# Patient Record
Sex: Male | Born: 1975 | Race: White | Hispanic: No | Marital: Married | Smoking: Former smoker
Health system: Southern US, Community
[De-identification: ages and names within clinical notes are randomized; demographics above are authoritative.]

## PROBLEM LIST (undated history)

## (undated) DIAGNOSIS — S42309A Unspecified fracture of shaft of humerus, unspecified arm, initial encounter for closed fracture: Secondary | ICD-10-CM

## (undated) DIAGNOSIS — G4700 Insomnia, unspecified: Secondary | ICD-10-CM

## (undated) DIAGNOSIS — F909 Attention-deficit hyperactivity disorder, unspecified type: Secondary | ICD-10-CM

## (undated) HISTORY — DX: Unspecified fracture of shaft of humerus, unspecified arm, initial encounter for closed fracture: S42.309A

## (undated) HISTORY — PX: VASECTOMY: SHX75

## (undated) HISTORY — DX: Attention-deficit hyperactivity disorder, unspecified type: F90.9

## (undated) HISTORY — DX: Insomnia, unspecified: G47.00

---

## 2007-11-13 ENCOUNTER — Emergency Department (HOSPITAL_COMMUNITY): Admission: EM | Admit: 2007-11-13 | Discharge: 2007-11-13 | Payer: Self-pay | Admitting: Emergency Medicine

## 2008-09-04 ENCOUNTER — Emergency Department (HOSPITAL_BASED_OUTPATIENT_CLINIC_OR_DEPARTMENT_OTHER): Admission: EM | Admit: 2008-09-04 | Discharge: 2008-09-04 | Payer: Self-pay | Admitting: Emergency Medicine

## 2011-05-21 ENCOUNTER — Ambulatory Visit: Payer: Worker's Compensation

## 2013-10-23 ENCOUNTER — Ambulatory Visit: Payer: Self-pay

## 2013-10-23 ENCOUNTER — Other Ambulatory Visit: Payer: Self-pay | Admitting: Occupational Medicine

## 2013-10-23 ENCOUNTER — Other Ambulatory Visit: Payer: Self-pay | Admitting: Family Medicine

## 2013-10-23 DIAGNOSIS — Z0000 Encounter for general adult medical examination without abnormal findings: Secondary | ICD-10-CM

## 2013-10-23 DIAGNOSIS — R911 Solitary pulmonary nodule: Secondary | ICD-10-CM

## 2013-10-25 ENCOUNTER — Ambulatory Visit
Admission: RE | Admit: 2013-10-25 | Discharge: 2013-10-25 | Disposition: A | Discharge status: Home / Self Care | Payer: 59 | Source: Ambulatory Visit | Attending: Family Medicine | Admitting: Family Medicine

## 2013-10-25 DIAGNOSIS — R911 Solitary pulmonary nodule: Secondary | ICD-10-CM

## 2013-10-25 MED ORDER — IOHEXOL 300 MG/ML  SOLN
75.0000 mL | Freq: Once | INTRAMUSCULAR | Status: AC | PRN
  Administered 2013-10-25: 75 mL via INTRAVENOUS

## 2015-02-07 ENCOUNTER — Other Ambulatory Visit: Payer: Self-pay | Admitting: Family Medicine

## 2015-02-07 DIAGNOSIS — R911 Solitary pulmonary nodule: Secondary | ICD-10-CM

## 2015-02-12 ENCOUNTER — Other Ambulatory Visit: Payer: Self-pay

## 2016-07-23 DIAGNOSIS — G479 Sleep disorder, unspecified: Secondary | ICD-10-CM | POA: Diagnosis not present

## 2016-08-03 DIAGNOSIS — J069 Acute upper respiratory infection, unspecified: Secondary | ICD-10-CM | POA: Diagnosis not present

## 2016-12-10 DIAGNOSIS — S90812A Abrasion, left foot, initial encounter: Secondary | ICD-10-CM | POA: Diagnosis not present

## 2016-12-14 DIAGNOSIS — T25221D Burn of second degree of right foot, subsequent encounter: Secondary | ICD-10-CM | POA: Diagnosis not present

## 2016-12-14 DIAGNOSIS — T25222D Burn of second degree of left foot, subsequent encounter: Secondary | ICD-10-CM | POA: Diagnosis not present

## 2017-11-03 DIAGNOSIS — G4726 Circadian rhythm sleep disorder, shift work type: Secondary | ICD-10-CM | POA: Diagnosis not present

## 2018-12-16 ENCOUNTER — Ambulatory Visit (INDEPENDENT_AMBULATORY_CARE_PROVIDER_SITE_OTHER): Payer: 59 | Admitting: Family Medicine

## 2018-12-16 ENCOUNTER — Encounter: Payer: Self-pay | Admitting: Family Medicine

## 2018-12-16 DIAGNOSIS — F909 Attention-deficit hyperactivity disorder, unspecified type: Secondary | ICD-10-CM | POA: Insufficient documentation

## 2018-12-16 DIAGNOSIS — G4700 Insomnia, unspecified: Secondary | ICD-10-CM

## 2018-12-16 MED ORDER — METHYLPHENIDATE HCL 20 MG PO TABS: 20.0000 mg | ORAL_TABLET | Freq: Three times a day (TID) | ORAL | 0 refills | Status: DC

## 2018-12-16 NOTE — Progress Notes (Signed)
Chief Complaint:  Danny Hurst is a 43 y.o. male who presents today for a virtual office visit with a chief complaint of ADHD and to establish care.  Assessment/Plan:  Insomnia Database reviewed no red flags.  He will continue taking Ambien 5 to 10 mg as needed.  Attention deficit hyperactivity disorder (ADHD) Database reviewed no red flags protein records from previous PCP.  Will refill Ritalin 20 mg 3 times daily.  Discussed potential side effects.  Follow-up in 3 to 6 months.    Subjective:  HPI:  ADHD Several year history.  Has been on methylphenidate 20 mg 3 times daily for the past 10 years.  He is switching to a new office to be closer to his home.  Previously his medications were managed by his PCP.  He has not been on any other medications for this in the past.  He thinks that his medications work relatively well.  Has not been on any medication for the past month or so.  No reported chest pain, palpitations, or shortness of breath.   Insomnia Patient also works alternating shift at [employer].  Will occasionally take Ambien 5 to 10 mg as needed to help with switching sleep schedules.  Typically works 3-4 nights shifts per week.  Medication works well.  Tolerates well without side effects.  Uses only a couple times per week.  No other obvious alleviating or aggravating factors.   ROS: Per HPI, otherwise a complete review of systems was negative.   PMH:  The following were reviewed and entered/updated in epic: Past Medical History:  Diagnosis Date  . ADHD   . Broken arm   . Insomnia    Patient Active Problem List   Diagnosis Date Noted  . Attention deficit hyperactivity disorder (ADHD) 12/16/2018  . Insomnia 12/16/2018   History reviewed. No pertinent surgical history.  Family History  Problem Relation Age of Onset  . Diabetes type I Daughter   . Heart disease Neg Hx   . Colon cancer Neg Hx   . Prostate cancer Neg Hx     Medications- reviewed and updated Current  Outpatient Medications  Medication Sig Dispense Refill  . methylphenidate (RITALIN) 20 MG tablet Take 1 tablet (20 mg total) by mouth 3 (three) times daily with meals. 90 tablet 0  . zolpidem (AMBIEN) 10 MG tablet TK 1 T PO QHS PRN     No current facility-administered medications for this visit.     Allergies-reviewed and updated No Known Allergies  Social History   Socioeconomic History  . Marital status: Married    Spouse name: Not on file  . Number of children: Not on file  . Years of education: Not on file  . Highest education level: Not on file  Occupational History  . Not on file  Social Needs  . Financial resource strain: Not on file  . Food insecurity    Worry: Not on file    Inability: Not on file  . Transportation needs    Medical: Not on file    Non-medical: Not on file  Tobacco Use  . Smoking status: Former Smoker    Years: 28.00    Types: Cigarettes    Start date: 1995    Quit date: 2013    Years since quitting: 7.6  . Smokeless tobacco: Former Network engineer and Sexual Activity  . Alcohol use: Not on file    Comment: occasional  . Drug use: Not on file  . Sexual  activity: Not on file  Lifestyle  . Physical activity    Days per week: Not on file    Minutes per session: Not on file  . Stress: Not on file  Relationships  . Social Herbalist on phone: Not on file    Gets together: Not on file    Attends religious service: Not on file    Active member of club or organization: Not on file    Attends meetings of clubs or organizations: Not on file    Relationship status: Not on file  Other Topics Concern  . Not on file  Social History Narrative  . Not on file          Objective/Observations  Physical Exam: Gen: NAD, resting comfortably Eyes: Extraocular movements intact.  No scleral icterus Cardiovascular: No peripheral edema Pulm: Normal work of breathing MSK: Upper extremities with full range of motion.  No digital cyanosis  Skin: No rashes or lesions. Neuro: Grossly normal, moves all extremities Psych: Normal affect and thought content  Virtual Visit via Video   I connected with Danny Hurst on 12/16/18 at 11:00 AM EDT by a video enabled telemedicine application and verified that I am speaking with the correct person using two identifiers. I discussed the limitations of evaluation and management by telemedicine and the availability of in person appointments. The patient expressed understanding and agreed to proceed.   Patient location: Home Provider location: [place] participating in the virtual visit: Myself and Patient     Algis Greenhouse. Jerline Pain, MD 12/16/2018 11:43 AM

## 2018-12-16 NOTE — Assessment & Plan Note (Signed)
Database reviewed no red flags protein records from previous PCP.  Will refill Ritalin 20 mg 3 times daily.  Discussed potential side effects.  Follow-up in 3 to 6 months.

## 2018-12-16 NOTE — Assessment & Plan Note (Signed)
Database reviewed no red flags.  He will continue taking Ambien 5 to 10 mg as needed.

## 2019-02-08 ENCOUNTER — Other Ambulatory Visit: Payer: Self-pay

## 2019-02-08 DIAGNOSIS — Z20822 Contact with and (suspected) exposure to covid-19: Secondary | ICD-10-CM

## 2019-02-09 LAB — NOVEL CORONAVIRUS, NAA: SARS-CoV-2, NAA: NOT DETECTED

## 2019-02-16 ENCOUNTER — Other Ambulatory Visit: Payer: Self-pay | Admitting: Family Medicine

## 2019-02-16 MED ORDER — METHYLPHENIDATE HCL 20 MG PO TABS: 20.0000 mg | ORAL_TABLET | Freq: Three times a day (TID) | ORAL | 0 refills | Status: DC

## 2019-02-16 NOTE — Telephone Encounter (Signed)
Last OV: 12/16/18 Next OV: None scheduled at this time PMP website checked: last filled 01/16/19

## 2019-03-20 ENCOUNTER — Other Ambulatory Visit: Payer: Self-pay | Admitting: Physician Assistant

## 2019-03-20 ENCOUNTER — Other Ambulatory Visit: Payer: Self-pay

## 2019-03-20 ENCOUNTER — Ambulatory Visit: Payer: Self-pay

## 2019-03-20 ENCOUNTER — Other Ambulatory Visit: Payer: Self-pay | Admitting: Family Medicine

## 2019-03-20 DIAGNOSIS — Z0000 Encounter for general adult medical examination without abnormal findings: Secondary | ICD-10-CM

## 2019-03-20 MED ORDER — METHYLPHENIDATE HCL 20 MG PO TABS: 20.0000 mg | ORAL_TABLET | Freq: Three times a day (TID) | ORAL | 0 refills | Status: DC

## 2019-03-20 NOTE — Telephone Encounter (Signed)
Rx request 

## 2019-04-18 ENCOUNTER — Other Ambulatory Visit: Payer: Self-pay | Admitting: Family Medicine

## 2019-04-19 MED ORDER — METHYLPHENIDATE HCL 20 MG PO TABS: 20.0000 mg | ORAL_TABLET | Freq: Three times a day (TID) | ORAL | 0 refills | Status: DC

## 2019-05-20 ENCOUNTER — Other Ambulatory Visit: Payer: Self-pay | Admitting: Family Medicine

## 2019-05-22 MED ORDER — METHYLPHENIDATE HCL 20 MG PO TABS: 20.0000 mg | ORAL_TABLET | Freq: Three times a day (TID) | ORAL | 0 refills | Status: DC

## 2019-05-22 NOTE — Telephone Encounter (Signed)
Last OV: 12/16/18 Next OV: none scheduled at this time PMP website checked: last filled on 04/19/19 for a qty of #90

## 2019-06-22 ENCOUNTER — Other Ambulatory Visit: Payer: Self-pay | Admitting: Family Medicine

## 2019-06-22 NOTE — Telephone Encounter (Signed)
Requesting refills, pt has not been seen since 12/22/18.

## 2019-06-23 MED ORDER — METHYLPHENIDATE HCL 20 MG PO TABS: 20.0000 mg | ORAL_TABLET | Freq: Three times a day (TID) | ORAL | 0 refills | Status: DC

## 2019-06-23 NOTE — Telephone Encounter (Signed)
Please ask pt to schedule f/u OV soon.

## 2019-07-21 ENCOUNTER — Telehealth: Payer: Self-pay | Admitting: Family Medicine

## 2019-07-21 MED ORDER — METHYLPHENIDATE HCL 20 MG PO TABS: 20.0000 mg | ORAL_TABLET | Freq: Three times a day (TID) | ORAL | 0 refills | Status: DC

## 2019-07-21 NOTE — Telephone Encounter (Signed)
LAST APPOINTMENT DATE:12/16/2018  NEXT APPOINTMENT DATE:@Visit  date not found  rX Ritalin 20mg  LAST REFILL:06/23/2019  QTY: #90 0Rf

## 2019-07-21 NOTE — Telephone Encounter (Signed)
He has not been seen in over 6 months - will send in 15 day supply. He needs OV for further refills.

## 2019-07-24 NOTE — Telephone Encounter (Signed)
Pt called stating [facility] does not have prescription. Please advise.

## 2019-07-24 NOTE — Telephone Encounter (Signed)
[employer].Rx was received.  LVM to Patient, needs OV, Rx was send to pharmacy

## 2019-07-28 ENCOUNTER — Other Ambulatory Visit: Payer: Self-pay

## 2019-07-28 ENCOUNTER — Ambulatory Visit (INDEPENDENT_AMBULATORY_CARE_PROVIDER_SITE_OTHER): Payer: 59 | Admitting: Family Medicine

## 2019-07-28 ENCOUNTER — Encounter: Payer: Self-pay | Admitting: Family Medicine

## 2019-07-28 VITALS — BP 110/80 | HR 55 | Temp 97.3°F | Ht 76.0 in | Wt 214.6 lb

## 2019-07-28 DIAGNOSIS — G4700 Insomnia, unspecified: Secondary | ICD-10-CM

## 2019-07-28 DIAGNOSIS — Z3009 Encounter for other general counseling and advice on contraception: Secondary | ICD-10-CM

## 2019-07-28 DIAGNOSIS — F909 Attention-deficit hyperactivity disorder, unspecified type: Secondary | ICD-10-CM | POA: Diagnosis not present

## 2019-07-28 DIAGNOSIS — L989 Disorder of the skin and subcutaneous tissue, unspecified: Secondary | ICD-10-CM | POA: Diagnosis not present

## 2019-07-28 MED ORDER — ZOLPIDEM TARTRATE 10 MG PO TABS: ORAL_TABLET | ORAL | 5 refills | Status: DC

## 2019-07-28 NOTE — Patient Instructions (Signed)
It was very nice to see you today!  We will send those 2 spots off your biopsy.  You should hear something back next week.  I will refill your medication.  We will place referral for you to see the urologist to discuss vasectomy.  Come back in 6 months, or sooner if needed  Take care, Dr Jerline Pain  Please try these tips to maintain a healthy lifestyle:   Eat at least 3 REAL meals and 1-2 snacks per day.  Aim for no more than 5 hours between eating.  If you eat breakfast, please do so within one hour of getting up.    Each meal should contain half fruits/vegetables, one quarter protein, and one quarter carbs (no bigger than a computer mouse)   Cut down on sweet beverages. This includes juice, soda, and sweet tea.     Drink at least 1 glass of water with each meal and aim for at least 8 glasses per day   Exercise at least 150 minutes every week.

## 2019-07-28 NOTE — Assessment & Plan Note (Signed)
Stable.  Database with no red flags.  Continue Ritalin 20 mg 3 times daily.  Follow-up in 6 months.

## 2019-07-28 NOTE — Progress Notes (Signed)
   Danny Hurst is a 44 y.o. male who presents today for an office visit.  Assessment/Plan:  New/Acute Problems: Skin Lesions Shave biopsy performed today.  See below procedure note.    Chronic Problems Addressed Today: Insomnia Database no red flags.  Symptoms are stable.  Will refill Ambien 5 to 10 mg nightly as needed due to shift work.  Attention deficit hyperactivity disorder (ADHD) Stable.  Database with no red flags.  Continue Ritalin 20 mg 3 times daily.  Follow-up in 6 months.     Subjective:  HPI:  Patient here for medication refill.  He is tolerating both his Ambien and Ritalin well without side effects.  No palpitations.  No reported chest pain or shortness of breath.  No reported next-day grogginess.  He also has a couple of skin lesions he would like to have looked at.  1 located on the left flank and one on right chest.  Both of them present for several months.       Objective:  Physical Exam: BP 110/80 (BP Location: Right Arm, Patient Position: Sitting, Cuff Size: Large)   Pulse (!) 55   Temp (!) 97.3 F (36.3 C) (Temporal)   Ht 6\' 4"  (1.93 m)   Wt 214 lb 9.6 oz (97.3 kg)   SpO2 96%   BMI 26.12 kg/m   Gen: No acute distress, resting comfortably CV: Regular rate and rhythm with no murmurs appreciated Pulm: Normal work of breathing, clear to auscultation bilaterally with no crackles, wheezes, or rhonchi Skin: Well-circumscribed 5 mm pigmented lesion on left flank.  Pedunculated fleshy lesion on right chest just below right nipple. Neuro: Grossly normal, moves all extremities Psych: Normal affect and thought content  Shave Biopsy Procedure Note  Pre-operative Diagnosis: Suspicious lesion  Post-operative Diagnosis: same  Locations: Right chest and left flank  Indications: Diagnostic  Anesthesia: 1% lidocaine with epinephrine.  Procedure Details  History of allergy to iodine: no  Patient informed of the risks (including bleeding and infection)  and benefits of the  procedure and Verbal informed consent obtained.  The left flank lesion and surrounding area were given a sterile prep using betadyne and draped in the usual sterile fashion. A scalpel was used to shave an area of skin approximately 1x1cm on the left flank. Hemostasis achieved with silver nitrate. Antibiotic ointment and a sterile dressing applied.  The specimen was sent for pathologic examination. The patient tolerated the procedure well.  The right chest lesion and surrounding area were given a sterile prep using betadyne and draped in the usual sterile fashion. A scalpel was used to shave an area of skin approximately 0.5x0.5cm on the right chest. Hemostasis achieved with silver nitrate. Antibiotic ointment and a sterile dressing applied.  The specimen was sent for pathologic examination. The patient tolerated the procedure well.  EBL: 2 ml  Findings: Suspicious skin lesion  Condition: Stable  Complications: none.  Plan: 1. Instructed to keep the wound dry and covered for 24-48h and clean thereafter. 2. Warning signs of infection were reviewed.   3. Recommended that the patient use OTC analgesics as needed for pain.      Algis Greenhouse. Jerline Pain, MD 07/28/2019 8:42 AM

## 2019-07-28 NOTE — Assessment & Plan Note (Signed)
Database no red flags.  Symptoms are stable.  Will refill Ambien 5 to 10 mg nightly as needed due to shift work.

## 2019-08-02 NOTE — Progress Notes (Signed)
Please inform patient of the following:  Good news! Both skin lesions we took off were benign - no need to do any further removal at this point.

## 2019-08-07 ENCOUNTER — Telehealth: Payer: Self-pay | Admitting: Family Medicine

## 2019-08-07 NOTE — Telephone Encounter (Signed)
Pt called stating Dr. Jerline Pain refilled his Ritalin 20 MG last week. Pt states the prescription only included 45 tablets which will only get him through half a month. Pt states he usually gets prescribed 90 tablets for a month. Please advise.

## 2019-08-07 NOTE — Telephone Encounter (Signed)
Please advise 

## 2019-08-08 MED ORDER — METHYLPHENIDATE HCL 20 MG PO TABS: 20.0000 mg | ORAL_TABLET | Freq: Three times a day (TID) | ORAL | 0 refills | Status: DC

## 2019-08-08 NOTE — Telephone Encounter (Signed)
New rx sent in with 90 tablets.   Algis Greenhouse. Jerline Pain, MD 08/08/2019 8:02 AM

## 2019-09-07 ENCOUNTER — Other Ambulatory Visit: Payer: Self-pay | Admitting: Family Medicine

## 2019-09-07 NOTE — Telephone Encounter (Signed)
Rx request 

## 2019-09-11 MED ORDER — METHYLPHENIDATE HCL 20 MG PO TABS: 20.0000 mg | ORAL_TABLET | Freq: Three times a day (TID) | ORAL | 0 refills | Status: DC

## 2019-10-11 ENCOUNTER — Other Ambulatory Visit: Payer: Self-pay | Admitting: Family Medicine

## 2019-10-11 MED ORDER — METHYLPHENIDATE HCL 20 MG PO TABS: 20.0000 mg | ORAL_TABLET | Freq: Three times a day (TID) | ORAL | 0 refills | Status: DC

## 2019-10-11 NOTE — Telephone Encounter (Signed)
Rx Request 

## 2019-11-13 ENCOUNTER — Other Ambulatory Visit: Payer: Self-pay | Admitting: Family Medicine

## 2019-11-13 MED ORDER — METHYLPHENIDATE HCL 20 MG PO TABS: 20.0000 mg | ORAL_TABLET | Freq: Three times a day (TID) | ORAL | 0 refills | Status: DC

## 2019-11-13 NOTE — Telephone Encounter (Signed)
Rx request 

## 2019-12-13 ENCOUNTER — Other Ambulatory Visit: Payer: Self-pay | Admitting: Family Medicine

## 2019-12-14 MED ORDER — METHYLPHENIDATE HCL 20 MG PO TABS: 20.0000 mg | ORAL_TABLET | Freq: Three times a day (TID) | ORAL | 0 refills | Status: DC

## 2019-12-14 NOTE — Telephone Encounter (Signed)
LAST APPOINTMENT DATE: 07/28/2019   NEXT APPOINTMENT DATE: Visit date not found    LAST REFILL: 11/13/2019  QTY: #90 no rf

## 2019-12-18 MED ORDER — METHYLPHENIDATE HCL 20 MG PO TABS: 20.0000 mg | ORAL_TABLET | Freq: Three times a day (TID) | ORAL | 0 refills | Status: DC

## 2019-12-18 NOTE — Addendum Note (Signed)
Addended by: Marian Sorrow on: 12/18/2019 09:27 AM   Modules accepted: Orders

## 2019-12-18 NOTE — Telephone Encounter (Signed)
Dr. Jerline Pain please resend did not go to pharmacy.

## 2020-01-17 ENCOUNTER — Other Ambulatory Visit: Payer: Self-pay | Admitting: Family Medicine

## 2020-01-17 NOTE — Telephone Encounter (Signed)
Pt requesting Rx Ritalin Refills

## 2020-01-17 NOTE — Telephone Encounter (Signed)
.   LAST APPOINTMENT DATE: 12/13/2019   NEXT APPOINTMENT DATE:@Visit  date not found  MEDICATION:methylphenidate (RITALIN) 20 MG tablet  [place], [place] - [address] AT [address]  **Let patient know to contact pharmacy at the end of the day to make sure medication is ready. **  ** Please notify patient to allow 48-72 hours to process**  **Encourage patient to contact the pharmacy for refills or they can request refills through [facility]**  CLINICAL FILLS OUT ALL BELOW:   LAST REFILL:  QTY:  REFILL DATE:    OTHER COMMENTS:    Okay for refill?  Please advise

## 2020-01-19 MED ORDER — METHYLPHENIDATE HCL 20 MG PO TABS: 20.0000 mg | ORAL_TABLET | Freq: Three times a day (TID) | ORAL | 0 refills | Status: DC

## 2020-01-23 ENCOUNTER — Other Ambulatory Visit: Payer: Self-pay

## 2020-01-23 ENCOUNTER — Telehealth (INDEPENDENT_AMBULATORY_CARE_PROVIDER_SITE_OTHER): Payer: 59 | Admitting: Family Medicine

## 2020-01-23 DIAGNOSIS — J0100 Acute maxillary sinusitis, unspecified: Secondary | ICD-10-CM | POA: Diagnosis not present

## 2020-01-23 MED ORDER — AMOXICILLIN-POT CLAVULANATE 875-125 MG PO TABS: 1.0000 | ORAL_TABLET | Freq: Two times a day (BID) | ORAL | 0 refills | Status: DC

## 2020-01-23 NOTE — Progress Notes (Signed)
Virtual Visit via Video Note  I connected with Tivon  on 01/23/20 at 10:20 AM EDT by a video enabled telemedicine application and verified that I am speaking with the correct person using two identifiers.  Location patient: home, [place] Location provider:work or home office Persons participating in the virtual visit: patient, provider  I discussed the limitations of evaluation and management by telemedicine and the availability of in person appointments. The patient expressed understanding and agreed to proceed.   HPI:  Acute telemedicine visit for sinus congestion: -Onset: started about 12-13 days ago -Symptoms include: sinus congestion, sinus drainage, now worsening with sinus pain when leans forward and thick sinus congestion -has had 3 negative tests - [employer] home kits -Denies: fevers, CP, SOB, NVD -Has tried: nasal saline -COVID-19 vaccine status: fully vaccinated -Pertinent past medical history: sinus infection  -denies a history of abx augmentin  ROS: See pertinent positives and negatives per HPI.  Past Medical History:  Diagnosis Date  . ADHD   . Broken arm   . Insomnia     No past surgical history on file.   Current Outpatient Medications:  .  amoxicillin-clavulanate (AUGMENTIN) 875-125 MG tablet, Take 1 tablet by mouth 2 (two) times daily., Disp: 20 tablet, Rfl: 0 .  methylphenidate (RITALIN) 20 MG tablet, Take 1 tablet (20 mg total) by mouth 3 (three) times daily with meals., Disp: 90 tablet, Rfl: 0 .  zolpidem (AMBIEN) 10 MG tablet, TK 1 T PO QHS PRN, Disp: 30 tablet, Rfl: 5  EXAM:  VITALS per patient if applicable:  GENERAL: alert, oriented, appears well and in no acute distress  HEENT: atraumatic, conjunttiva clear, no obvious abnormalities on inspection of external nose and ears, He points to his right maxillary sinus is an area of concern.  NECK: normal movements of the head and neck  LUNGS: on inspection no signs of respiratory distress, breathing  rate appears normal, no obvious gross SOB, gasping or wheezing  CV: no obvious cyanosis  MS: moves all visible extremities without noticeable abnormality  PSYCH/NEURO: pleasant and cooperative, no obvious depression or anxiety, speech and thought processing grossly intact  ASSESSMENT AND PLAN:  Discussed the following assessment and plan:  Acute maxillary sinusitis, recurrence not specified  -we discussed possible serious and likely etiologies, options for evaluation and workup, limitations of telemedicine visit vs in person visit, treatment, treatment risks and precautions. Pt prefers to treat via telemedicine empirically rather then risking or undertaking an in person visit at this moment.  He opted to try empiric treatment with Augmentin 875 twice daily x7 to 10 days for possible sinusitis. Scheduled follow up with PCP offered:  Advised to seek prompt follow up telemedicine visit or in person care if worsening, new symptoms arise, or if is not improving with treatment. Did let the patient know that I only do telemedicine on Tuesdays and Thursdays for [place] and advised follow up visit with PCP or [facility] in the event of further concerns or questions.   I discussed the assessment and treatment plan with the patient. The patient was provided an opportunity to ask questions and all were answered. The patient agreed with the plan and demonstrated an understanding of the instructions.     Lucretia Kern, DO

## 2020-02-21 ENCOUNTER — Telehealth (INDEPENDENT_AMBULATORY_CARE_PROVIDER_SITE_OTHER): Payer: 59 | Admitting: Family Medicine

## 2020-02-21 ENCOUNTER — Encounter: Payer: Self-pay | Admitting: Family Medicine

## 2020-02-21 DIAGNOSIS — F909 Attention-deficit hyperactivity disorder, unspecified type: Secondary | ICD-10-CM

## 2020-02-21 DIAGNOSIS — G4700 Insomnia, unspecified: Secondary | ICD-10-CM

## 2020-02-21 MED ORDER — METHYLPHENIDATE HCL 20 MG PO TABS: 20.0000 mg | ORAL_TABLET | Freq: Three times a day (TID) | ORAL | 0 refills | Status: DC

## 2020-02-21 MED ORDER — METHYLPHENIDATE HCL 20 MG PO TABS: 20.0000 mg | ORAL_TABLET | Freq: Two times a day (BID) | ORAL | 0 refills | Status: DC

## 2020-02-21 MED ORDER — ZOLPIDEM TARTRATE 10 MG PO TABS: ORAL_TABLET | ORAL | 5 refills | Status: DC

## 2020-02-21 NOTE — Assessment & Plan Note (Signed)
Stable.  MS with no red flags.  Medications help with that he stay focused and on task.  We will continue Ritalin 20 mg 3 times daily.

## 2020-02-21 NOTE — Assessment & Plan Note (Signed)
Database without red flags. No reported side effects.  Will refill Ambien 10 mg daily.

## 2020-02-21 NOTE — Progress Notes (Signed)
   ELLEN MAYOL is a 44 y.o. male who presents today for a virtual office visit.  Assessment/Plan:  Chronic Problems Addressed Today: Insomnia Database without red flags. No reported side effects.  Will refill Ambien 10 mg daily.  Attention deficit hyperactivity disorder (ADHD) Stable.  MS with no red flags.  Medications help with that he stay focused and on task.  We will continue Ritalin 20 mg 3 times daily.     Subjective:  HPI:  See A/p.         Objective/Observations  Physical Exam: Gen: NAD, resting comfortably Pulm: Normal work of breathing Neuro: Grossly normal, moves all extremities Psych: Normal affect and thought content  Virtual Visit via Video   I connected with Mariel Craft on 02/21/20 at 11:20 AM EDT by a video enabled telemedicine application and verified that I am speaking with the correct person using two identifiers. The limitations of evaluation and management by telemedicine and the availability of in person appointments were discussed. The patient expressed understanding and agreed to proceed.   Patient location: Home Provider location: [place] participating in the virtual visit: Myself and Patient     Algis Greenhouse. Jerline Pain, MD 02/21/2020 11:26 AM

## 2020-03-22 ENCOUNTER — Telehealth: Payer: Self-pay

## 2020-03-22 NOTE — Telephone Encounter (Signed)
See below

## 2020-03-22 NOTE — Telephone Encounter (Signed)
..   LAST APPOINTMENT DATE: 01/17/2020   NEXT APPOINTMENT DATE:@Visit  date not found  MEDICATION:methylphenidate (RITALIN) 20 MG tablet    PHARMACY: [facility] [place], [place] AT [address]     Pt is on his last pill and leaving for [facility] on Sunday. I told pt that if this does not get filled today, because Dr. Jerline Pain is out, then to call us Monday morning and give [place] a pharmacy near where he will be to send it too.

## 2020-03-25 NOTE — Telephone Encounter (Signed)
LVM to return call with pharmacy information

## 2020-03-25 NOTE — Telephone Encounter (Signed)
I am ok with sending in but need to know what pharmacy to send it to.  Algis Greenhouse. Jerline Pain, MD 03/25/2020 8:00 AM

## 2020-03-26 ENCOUNTER — Other Ambulatory Visit: Payer: Self-pay

## 2020-03-26 MED ORDER — METHYLPHENIDATE HCL 20 MG PO TABS: 20.0000 mg | ORAL_TABLET | Freq: Three times a day (TID) | ORAL | 0 refills | Status: DC

## 2020-03-26 NOTE — Telephone Encounter (Signed)
MEDICATION: Ritalin 20 MG  PHARMACY: [facility]   [address], [place] [address]  Comments:   **Let patient know to contact pharmacy at the end of the day to make sure medication is ready. **  ** Please notify patient to allow 48-72 hours to process**  **Encourage patient to contact the pharmacy for refills or they can request refills through [facility]**

## 2020-03-26 NOTE — Telephone Encounter (Signed)
Refills sent

## 2020-03-26 NOTE — Telephone Encounter (Signed)
Pt requesting refills send to [facility] pharmacy  [place] [address]

## 2020-03-26 NOTE — Telephone Encounter (Signed)
Patient call with Pharmacy information

## 2020-04-25 ENCOUNTER — Other Ambulatory Visit: Payer: Self-pay | Admitting: Family Medicine

## 2020-04-25 MED ORDER — METHYLPHENIDATE HCL 20 MG PO TABS: 20.0000 mg | ORAL_TABLET | Freq: Three times a day (TID) | ORAL | 0 refills | Status: DC

## 2020-05-27 ENCOUNTER — Other Ambulatory Visit: Payer: Self-pay | Admitting: Family Medicine

## 2020-05-28 MED ORDER — METHYLPHENIDATE HCL 20 MG PO TABS: 20.0000 mg | ORAL_TABLET | Freq: Three times a day (TID) | ORAL | 0 refills | Status: DC

## 2020-05-28 NOTE — Telephone Encounter (Signed)
Last refill: 04/25/20 #90, 0 Last OV: 02/21/20 dx. ADHD

## 2020-06-27 ENCOUNTER — Other Ambulatory Visit: Payer: Self-pay | Admitting: Family Medicine

## 2020-06-27 MED ORDER — METHYLPHENIDATE HCL 20 MG PO TABS: 20.0000 mg | ORAL_TABLET | Freq: Three times a day (TID) | ORAL | 0 refills | Status: DC

## 2020-07-24 ENCOUNTER — Other Ambulatory Visit: Payer: Self-pay | Admitting: Family Medicine

## 2020-07-24 MED ORDER — METHYLPHENIDATE HCL 20 MG PO TABS: 20.0000 mg | ORAL_TABLET | Freq: Three times a day (TID) | ORAL | 0 refills | Status: DC

## 2020-08-23 ENCOUNTER — Other Ambulatory Visit: Payer: Self-pay | Admitting: Family Medicine

## 2020-08-23 MED ORDER — METHYLPHENIDATE HCL 20 MG PO TABS: 20.0000 mg | ORAL_TABLET | Freq: Three times a day (TID) | ORAL | 0 refills | Status: DC

## 2020-08-23 NOTE — Telephone Encounter (Signed)
Please let patient know he needs a 6 month follow up visit for more refills.

## 2020-09-23 ENCOUNTER — Other Ambulatory Visit: Payer: Self-pay | Admitting: Family Medicine

## 2020-09-23 MED ORDER — METHYLPHENIDATE HCL 20 MG PO TABS: 20.0000 mg | ORAL_TABLET | Freq: Three times a day (TID) | ORAL | 0 refills | Status: DC

## 2020-09-23 NOTE — Telephone Encounter (Signed)
Needs office visit for refills.

## 2020-10-22 ENCOUNTER — Other Ambulatory Visit: Payer: Self-pay | Admitting: Family Medicine

## 2020-10-25 ENCOUNTER — Telehealth (INDEPENDENT_AMBULATORY_CARE_PROVIDER_SITE_OTHER): Payer: 59 | Admitting: Family Medicine

## 2020-10-25 ENCOUNTER — Encounter: Payer: Self-pay | Admitting: Family Medicine

## 2020-10-25 ENCOUNTER — Other Ambulatory Visit: Payer: Self-pay

## 2020-10-25 DIAGNOSIS — G4700 Insomnia, unspecified: Secondary | ICD-10-CM

## 2020-10-25 DIAGNOSIS — F909 Attention-deficit hyperactivity disorder, unspecified type: Secondary | ICD-10-CM

## 2020-10-25 MED ORDER — ZOLPIDEM TARTRATE 10 MG PO TABS: ORAL_TABLET | ORAL | 5 refills | Status: DC

## 2020-10-25 MED ORDER — METHYLPHENIDATE HCL 20 MG PO TABS: 20.0000 mg | ORAL_TABLET | Freq: Three times a day (TID) | ORAL | 0 refills | Status: DC

## 2020-10-25 NOTE — Assessment & Plan Note (Signed)
Database no red flags.  Doing well with Ambien 10 mg nightly as needed.  Will refill today.

## 2020-10-25 NOTE — Progress Notes (Signed)
   Danny Hurst is a 45 y.o. male who presents today for a virtual office visit.  Assessment/Plan:  Chronic Problems Addressed Today: Insomnia Database no red flags.  Doing well with Ambien 10 mg nightly as needed.  Will refill today.  Attention deficit hyperactivity disorder (ADHD) Database no red flags.  On Ritalin 20 mg 3 times daily with meals.  Medications help with ability to stay focused and on task.  No side effects.  Will refill today.  Follow-up in 6 months.     Subjective:  HPI:  See A/p.         Objective/Observations  Physical Exam: Gen: NAD, resting comfortably Pulm: Normal work of breathing Neuro: Grossly normal, moves all extremities Psych: Normal affect and thought content  Virtual Visit via Video   I connected with Danny Hurst on 10/25/20 at  1:20 PM EDT by a video enabled telemedicine application and verified that I am speaking with the correct person using two identifiers. The limitations of evaluation and management by telemedicine and the availability of in person appointments were discussed. The patient expressed understanding and agreed to proceed.   Patient location: Home Provider location: [place] participating in the virtual visit: Myself and Patient      Algis Greenhouse. Jerline Pain, MD 10/25/2020 1:46 PM

## 2020-10-25 NOTE — Assessment & Plan Note (Signed)
Database no red flags.  On Ritalin 20 mg 3 times daily with meals.  Medications help with ability to stay focused and on task.  No side effects.  Will refill today.  Follow-up in 6 months.

## 2020-10-28 ENCOUNTER — Ambulatory Visit: Payer: 59 | Admitting: Family Medicine

## 2020-11-24 ENCOUNTER — Other Ambulatory Visit: Payer: Self-pay | Admitting: Family Medicine

## 2020-11-25 MED ORDER — METHYLPHENIDATE HCL 20 MG PO TABS: 20.0000 mg | ORAL_TABLET | Freq: Three times a day (TID) | ORAL | 0 refills | Status: DC

## 2020-12-16 ENCOUNTER — Ambulatory Visit (HOSPITAL_BASED_OUTPATIENT_CLINIC_OR_DEPARTMENT_OTHER)
Admission: RE | Admit: 2020-12-16 | Discharge: 2020-12-16 | Disposition: A | Discharge status: Home / Self Care | Payer: 59 | Source: Ambulatory Visit | Attending: Family Medicine | Admitting: Family Medicine

## 2020-12-16 ENCOUNTER — Telehealth: Payer: Self-pay

## 2020-12-16 ENCOUNTER — Encounter: Payer: Self-pay | Admitting: Family Medicine

## 2020-12-16 ENCOUNTER — Other Ambulatory Visit: Payer: Self-pay

## 2020-12-16 ENCOUNTER — Ambulatory Visit (INDEPENDENT_AMBULATORY_CARE_PROVIDER_SITE_OTHER): Payer: 59 | Admitting: Family Medicine

## 2020-12-16 VITALS — BP 110/82 | HR 66 | Temp 98.1°F | Resp 20 | Ht 76.0 in | Wt 191.6 lb

## 2020-12-16 DIAGNOSIS — R1114 Bilious vomiting: Secondary | ICD-10-CM | POA: Insufficient documentation

## 2020-12-16 DIAGNOSIS — R1011 Right upper quadrant pain: Secondary | ICD-10-CM | POA: Insufficient documentation

## 2020-12-16 NOTE — Patient Instructions (Signed)
I will contact you when I have some results on your ultrasound and labs.   Abdominal Pain, Adult Pain in the abdomen (abdominal pain) can be caused by many things. Often, abdominal pain is not serious and it gets better with no treatment or by being treated at home. However, sometimesabdominal pain is serious. Your health care provider will ask questions about your medical history and doa physical exam to try to determine the cause of your abdominal pain. Follow these instructions at home: Medicines Take over-the-counter and prescription medicines only as told by your health care provider. Do not take a laxative unless told by your health care provider. General instructions  Watch your condition for any changes. Drink enough fluid to keep your urine pale yellow. Keep all follow-up visits as told by your health care provider. This is important.  Contact a health care provider if: Your abdominal pain changes or gets worse. You are not hungry or you lose weight without trying. You are constipated or have diarrhea for more than 2-3 days. You have pain when you urinate or have a bowel movement. Your abdominal pain wakes you up at night. Your pain gets worse with meals, after eating, or with certain foods. You are vomiting and cannot keep anything down. You have a fever. You have blood in your urine. Get help right away if: Your pain does not go away as soon as your health care provider told you to expect. You cannot stop vomiting. Your pain is only in areas of the abdomen, such as the right side or the left lower portion of the abdomen. Pain on the right side could be caused by appendicitis. You have bloody or black stools, or stools that look like tar. You have severe pain, cramping, or bloating in your abdomen. You have signs of dehydration, such as: Dark urine, very little urine, or no urine. Cracked lips. Dry mouth. Sunken eyes. Sleepiness. Weakness. You have trouble breathing or  chest pain. Summary Often, abdominal pain is not serious and it gets better with no treatment or by being treated at home. However, sometimes abdominal pain is serious. Watch your condition for any changes. Take over-the-counter and prescription medicines only as told by your health care provider. Contact a health care provider if your abdominal pain changes or gets worse. Get help right away if you have severe pain, cramping, or bloating in your abdomen. This information is not intended to replace advice given to you by your health care provider. Make sure you discuss any questions you have with your healthcare provider. Document Revised: 06/09/2019 Document Reviewed: 08/29/2018 Elsevier Patient Education  [employer].

## 2020-12-16 NOTE — Telephone Encounter (Signed)
Patient has an appointment today.   Nurse Assessment Nurse: Clovis Riley RN, Georgina Peer Date/Time (Eastern Time): 12/16/2020 8:33:08 AM Confirm and document reason for call. If symptomatic, describe symptoms. ---Caller states the patient has had has lose stools for 5 weeks that had a strong odor. HE drove to [place] about 5 days ago and had a cheeseburger and got really sick. He started having abdominal pain in the upper right quandrant at night it wakes him up. He has been eating toast with nothing. He has been vomiting also. Does the patient have any new or worsening symptoms? ---Yes Will a triage be completed? ---Yes Related visit to physician within the last 2 weeks? ---No Does the PT have any chronic conditions? (i.e. diabetes, asthma, this includes High risk factors for pregnancy, etc.) ---No Is this a behavioral health or substance abuse call? ---No PLEASE NOTE: All timestamps contained within this report are represented as [place] Standard Time. CONFIDENTIALTY NOTICE: This fax transmission is intended only for the addressee. It contains information that is legally privileged, confidential or otherwise protected from use or disclosure. If you are not the intended recipient, you are strictly prohibited from reviewing, disclosing, copying using or disseminating any of this information or taking any action in reliance on or regarding this information. If you have received this fax in error, please notify us immediately by telephone so that we can arrange for its return to [place]. Phone: [phone], Toll-Free: [phone], Fax: [phone] Page: 2 of 2 Call Id: [id number] Guidelines Guideline Title Affirmed Question Affirmed Notes Nurse Date/Time Eilene Ghazi Time) Abdominal Pain - Upper [1] MILDMODERATE pain AND [2] constant AND [3] present > 2 hours Clovis Riley, RNGeorgina Peer 12/16/2020 8:37:14 AM Disp. Time Eilene Ghazi Time) Disposition Final User 12/16/2020 8:40:16 AM See HCP within 4 Hours  (or PCP triage) Yes Clovis Riley, RN, Leilani Merl Disagree/Comply Comply Caller Understands Yes PreDisposition Call Doctor Care Advice Given Per Guideline SEE HCP (OR PCP TRIAGE) WITHIN 4 HOURS: * IF OFFICE WILL BE OPEN: You need to be seen within the next 3 or 4 hours. Call your doctor (or NP/PA) now or as soon as the office opens. TAKE AN ANTACID: * If having pain now, try taking an antacid (e.g., Mylanta, Maalox). * Dose: Take 2 tablespoons (30 ml) of liquid by mouth. CALL BACK IF: * You become worse CARE ADVICE given per Abdominal Pain, Upper (Adult) guideline. Referrals REFERRED TO PCP OFFICE

## 2020-12-16 NOTE — Progress Notes (Signed)
Subjective:  Patient ID: Danny Hurst, male    DOB: 12-04-1975  Age: 45 y.o. MRN: [id number]  CC:  Chief Complaint  Patient presents with   Abdominal Pain    Feeling ok right now. Had diarrhea off and on and was hurting his rt upper quadrant    HPI Danny Hurst presents for   RUQ abdominal pain: Started 4-5 weeks ago. Some associated loose stools. Was eating breakfast sausage, greasy food. Bad smell with diarrhea.  Usually eats healthier food.  Mother in law passed today colorectal and liver CA.  Had cheeseburger 6 days ago when traveling to see mother in law. After eating, some immediate vomiting. No hematemesis. RUQ pain that night with some residual n/v. No fever, but continued RUQ pain next day. Slight nausea next day. One more episode of vomiting. Bile only.  Negative covid testing.  6-8# weight loss past week. Not able to eat - toast/water. Able to eat bread. Nausea thinking about eating. Drinking water ok.  No pain in office today.  No prior abdominal surgeries.  No sick contacts.  No CP/dyspnea.  Will be traveling back to [place] on Wednesday for mother in [place] pain 8/10, no pain currently.  No yellowing of skin or eyes.  Tx: tylenol.  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  No recent alcohol.    History Patient Active Problem List   Diagnosis Date Noted   Attention deficit hyperactivity disorder (ADHD) 12/16/2018   Insomnia 12/16/2018   Past Medical History:  Diagnosis Date   ADHD    Broken arm    Insomnia    Past Surgical History:  Procedure Laterality Date   VASECTOMY     No Known Allergies Prior to Admission medications   Medication Sig Start Date End Date Taking? Authorizing Provider  methylphenidate (RITALIN) 20 MG tablet Take 1 tablet (20 mg total) by mouth 3 (three) times daily with meals. 12/24/20  Yes Vivi Barrack, MD  zolpidem (AMBIEN) 10 MG tablet TK 1 T PO QHS PRN 10/25/20   Yes Vivi Barrack, MD  methylphenidate (RITALIN) 20 MG tablet Take 1 tablet (20 mg total) by mouth 3 (three) times daily with meals. Patient not taking: Reported on 12/16/2020 11/24/20   Vivi Barrack, MD  methylphenidate (RITALIN) 20 MG tablet Take 1 tablet (20 mg total) by mouth 3 (three) times daily with meals. Patient not taking: Reported on 12/16/2020 11/25/20   Vivi Barrack, MD   Social History   Socioeconomic History   Marital status: Married    Spouse name: Not on file   Number of children: Not on file   Years of education: Not on file   Highest education level: Not on file  Occupational History   Not on file  Tobacco Use   Smoking status: Former    Years: 28.00    Types: Cigarettes    Start date: 21    Quit date: 2013    Years since quitting: 9.6   Smokeless tobacco: Former  Substance and Sexual Activity   Alcohol use: Not on file    Comment: occasional   Drug use: Not on file   Sexual activity: Yes  Other Topics Concern   Not on file  Social History Narrative   Not on file   Social Determinants of Health   Financial Resource Strain: Not on file  Food Insecurity: Not on file  Transportation Needs: Not on  file  Physical Activity: Not on file  Stress: Not on file  Social Connections: Not on file  Intimate Partner Violence: Not on file    Review of Systems Per HPI   Objective:   Vitals:   12/16/20 1417  BP: 110/82  Pulse: 66  Resp: 20  Temp: 98.1 F (36.7 C)  TempSrc: Temporal  SpO2: 99%  Weight: 191 lb 9.6 oz (86.9 kg)  Height: '6\' 4"'$  (1.93 m)     Physical Exam Vitals reviewed.  Constitutional:      Appearance: He is well-developed.  HENT:     Head: Normocephalic and atraumatic.  Neck:     Vascular: No carotid bruit or JVD.  Cardiovascular:     Rate and Rhythm: Normal rate and regular rhythm.     Heart sounds: Normal heart sounds. No murmur heard. Pulmonary:     Effort: Pulmonary effort is normal.     Breath sounds: Normal breath  sounds. No rales.  Abdominal:     Tenderness: There is abdominal tenderness in the right upper quadrant. There is no right CVA tenderness, left CVA tenderness, guarding or rebound. Negative signs include Murphy's sign and McBurney's sign.  Musculoskeletal:     Right lower leg: No edema.     Left lower leg: No edema.  Skin:    General: Skin is warm and dry.  Neurological:     Mental Status: He is alert and oriented to person, place, and time.  Psychiatric:        Mood and Affect: Mood normal.       Assessment & Plan:  Danny Hurst is a 45 y.o. male . RUQ abdominal pain - Plan: CBC with Differential/Platelet, Comprehensive metabolic panel, Lipase, US Abdomen Limited RUQ (LIVER/GB)  Bilious vomiting with nausea - Plan: CBC with Differential/Platelet, Comprehensive metabolic panel, Lipase, US Abdomen Limited RUQ (LIVER/GB)  Initially suspicious for cholecystitis/cholelithiasis.  Minimal discomfort on exam in office, no rebound or guarding, afebrile.  Check CBC, CMP, lipase.  Ultrasound was obtained without concerns.  He is planning on going out of town in 2 days.  May need to check further imaging prior to travel but will check lab results first.  Advised patient regarding ultrasound results.    No orders of the defined types were placed in this encounter.  Patient Instructions  I will contact you when I have some results on your ultrasound and labs.   Abdominal Pain, Adult Pain in the abdomen (abdominal pain) can be caused by many things. Often, abdominal pain is not serious and it gets better with no treatment or by being treated at home. However, sometimesabdominal pain is serious. Your health care provider will ask questions about your medical history and doa physical exam to try to determine the cause of your abdominal pain. Follow these instructions at home: Medicines Take over-the-counter and prescription medicines only as told by your health care provider. Do not take a  laxative unless told by your health care provider. General instructions  Watch your condition for any changes. Drink enough fluid to keep your urine pale yellow. Keep all follow-up visits as told by your health care provider. This is important.  Contact a health care provider if: Your abdominal pain changes or gets worse. You are not hungry or you lose weight without trying. You are constipated or have diarrhea for more than 2-3 days. You have pain when you urinate or have a bowel movement. Your abdominal pain wakes you up at night. Your  pain gets worse with meals, after eating, or with certain foods. You are vomiting and cannot keep anything down. You have a fever. You have blood in your urine. Get help right away if: Your pain does not go away as soon as your health care provider told you to expect. You cannot stop vomiting. Your pain is only in areas of the abdomen, such as the right side or the left lower portion of the abdomen. Pain on the right side could be caused by appendicitis. You have bloody or black stools, or stools that look like tar. You have severe pain, cramping, or bloating in your abdomen. You have signs of dehydration, such as: Dark urine, very little urine, or no urine. Cracked lips. Dry mouth. Sunken eyes. Sleepiness. Weakness. You have trouble breathing or chest pain. Summary Often, abdominal pain is not serious and it gets better with no treatment or by being treated at home. However, sometimes abdominal pain is serious. Watch your condition for any changes. Take over-the-counter and prescription medicines only as told by your health care provider. Contact a health care provider if your abdominal pain changes or gets worse. Get help right away if you have severe pain, cramping, or bloating in your abdomen. This information is not intended to replace advice given to you by your health care provider. Make sure you discuss any questions you have with your  healthcare provider. Document Revised: 06/09/2019 Document Reviewed: 08/29/2018 Elsevier Patient Education  2022 [place],   Merri Ray, MD [place] 12/17/20 9:23 AM

## 2020-12-16 NOTE — Telephone Encounter (Signed)
See note

## 2020-12-17 LAB — COMPREHENSIVE METABOLIC PANEL
ALT: 18 U/L (ref 0–53)
AST: 17 U/L (ref 0–37)
Albumin: 3.9 g/dL (ref 3.5–5.2)
Alkaline Phosphatase: 49 U/L (ref 39–117)
BUN: 10 mg/dL (ref 6–23)
CO2: 29 mEq/L (ref 19–32)
Calcium: 9 mg/dL (ref 8.4–10.5)
Chloride: 99 mEq/L (ref 96–112)
Creatinine, Ser: 0.96 mg/dL (ref 0.40–1.50)
GFR: 95.7 mL/min (ref >60.00)
Glucose, Bld: 85 mg/dL (ref 70–99)
Potassium: 4.3 mEq/L (ref 3.5–5.1)
Sodium: 136 mEq/L (ref 135–145)
Total Bilirubin: 0.5 mg/dL (ref 0.2–1.2)
Total Protein: 6.8 g/dL (ref 6.0–8.3)

## 2020-12-17 LAB — CBC WITH DIFFERENTIAL/PLATELET
Basophils Absolute: 0 10*3/uL (ref 0.0–0.1)
Basophils Relative: 0.6 % (ref 0.0–3.0)
Eosinophils Absolute: 0.3 10*3/uL (ref 0.0–0.7)
Eosinophils Relative: 5.3 % — ABNORMAL HIGH (ref 0.0–5.0)
HCT: 41.8 % (ref 39.0–52.0)
Hemoglobin: 14.1 g/dL (ref 13.0–17.0)
Lymphocytes Relative: 24.2 % (ref 12.0–46.0)
Lymphs Abs: 1.6 10*3/uL (ref 0.7–4.0)
MCHC: 33.8 g/dL (ref 30.0–36.0)
MCV: 90.1 fl (ref 78.0–100.0)
Monocytes Absolute: 0.6 10*3/uL (ref 0.1–1.0)
Monocytes Relative: 8.6 % (ref 3.0–12.0)
Neutro Abs: 4 10*3/uL (ref 1.4–7.7)
Neutrophils Relative %: 61.3 % (ref 43.0–77.0)
Platelets: 264 10*3/uL (ref 150.0–400.0)
RBC: 4.64 Mil/uL (ref 4.22–5.81)
RDW: 13.8 % (ref 11.5–15.5)
WBC: 6.5 10*3/uL (ref 4.0–10.5)

## 2020-12-17 LAB — LIPASE: Lipase: 33 U/L (ref 11.0–59.0)

## 2020-12-25 ENCOUNTER — Other Ambulatory Visit: Payer: Self-pay | Admitting: Family Medicine

## 2020-12-26 MED ORDER — METHYLPHENIDATE HCL 20 MG PO TABS: 20.0000 mg | ORAL_TABLET | Freq: Three times a day (TID) | ORAL | 0 refills | Status: DC

## 2021-01-06 IMAGING — DX DG CHEST 1V
1 series · 1 of 1 positions shown · non-contrast
Comparison: CT 10/25/2013. Chest radiograph 10/23/2013.

CLINICAL DATA: Physical exam. Nonsmoker.

EXAM:
CHEST  1 VIEW

[chest pa]
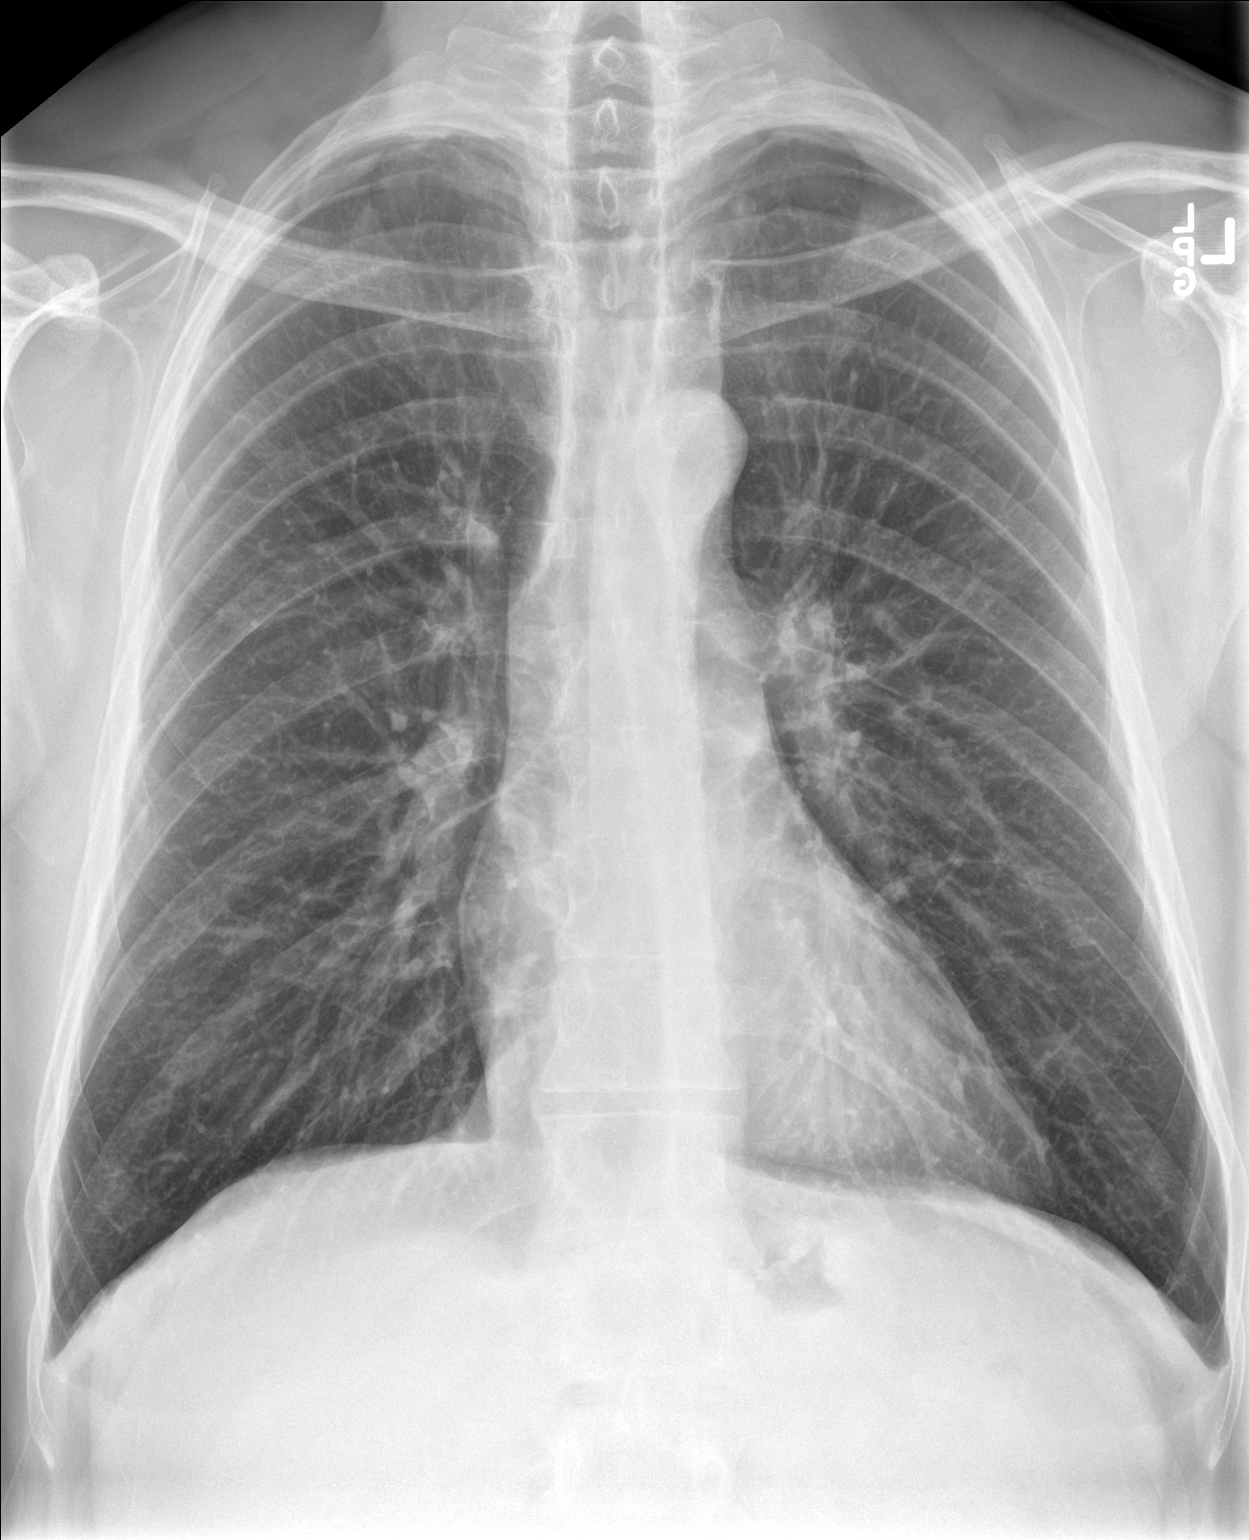

[1 of 1 positions shown; findings below may reference images not displayed]

FINDINGS: Frontal radiograph. Midline trachea. Normal heart size and
mediastinal contours. No pleural effusion or pneumothorax. Mild
hyperinflation. Clear lungs.
IMPRESSION: Hyperinflation, without acute disease.

## 2021-01-26 ENCOUNTER — Other Ambulatory Visit: Payer: Self-pay | Admitting: Family Medicine

## 2021-01-27 MED ORDER — METHYLPHENIDATE HCL 20 MG PO TABS: 20.0000 mg | ORAL_TABLET | Freq: Three times a day (TID) | ORAL | 0 refills | Status: DC

## 2021-01-30 ENCOUNTER — Ambulatory Visit: Payer: 59 | Admitting: Physician Assistant

## 2021-01-30 ENCOUNTER — Other Ambulatory Visit: Payer: Self-pay

## 2021-01-30 ENCOUNTER — Encounter: Payer: Self-pay | Admitting: Physician Assistant

## 2021-01-30 VITALS — BP 126/75 | HR 67 | Temp 98.3°F | Ht 76.0 in | Wt 190.2 lb

## 2021-01-30 DIAGNOSIS — K4090 Unilateral inguinal hernia, without obstruction or gangrene, not specified as recurrent: Secondary | ICD-10-CM | POA: Diagnosis not present

## 2021-01-30 NOTE — Progress Notes (Signed)
Subjective:    Patient ID: Danny Hurst, male    DOB: 03-Feb-1976, 45 y.o.   MRN: [id number]  Chief Complaint  Patient presents with   Hernia    HPI Patient is in today for possible right sided inguinal hernia x approx 2 months. Vasectomy about three months ago. Discomfort with coughing or sneezing, but not much pain. No recent heavy lifting or pulling. No urinary issues. No abdominal pain. No back pain. No fever or chills. No nausea or vomiting or other symptoms.   Past Medical History:  Diagnosis Date   ADHD    Broken arm    Insomnia     Past Surgical History:  Procedure Laterality Date   VASECTOMY      Family History  Problem Relation Age of Onset   Diabetes type I Daughter    Heart disease Neg Hx    Colon cancer Neg Hx    Prostate cancer Neg Hx     Social History   Tobacco Use   Smoking status: Former    Years: 28.00    Types: Cigarettes    Start date: 1995    Quit date: 2013    Years since quitting: 9.7   Smokeless tobacco: Former     No Known Allergies  Review of Systems REFER TO HPI FOR PERTINENT POSITIVES AND NEGATIVES      Objective:     BP 126/75   Pulse 67   Temp 98.3 F (36.8 C)   Ht 6\' 4"  (1.93 m)   Wt 190 lb 3.2 oz (86.3 kg)   SpO2 97%   BMI 23.15 kg/m   Wt Readings from Last 3 Encounters:  01/30/21 190 lb 3.2 oz (86.3 kg)  12/16/20 191 lb 9.6 oz (86.9 kg)  07/28/19 214 lb 9.6 oz (97.3 kg)    BP Readings from Last 3 Encounters:  01/30/21 126/75  12/16/20 110/82  07/28/19 110/80     Physical Exam Vitals and nursing note reviewed. Exam conducted with a chaperone present.  Constitutional:      Appearance: Normal appearance.  Cardiovascular:     Rate and Rhythm: Normal rate and regular rhythm.     Pulses: Normal pulses.     Heart sounds: No murmur heard. Pulmonary:     Effort: Pulmonary effort is normal.     Breath sounds: Normal breath sounds.  Abdominal:     General: Abdomen is flat. Bowel sounds are normal.      Palpations: Abdomen is soft.     Tenderness: There is no right CVA tenderness or left CVA tenderness.     Hernia: A hernia is present. Hernia is present in the right inguinal area.  Neurological:     Mental Status: He is alert.       Assessment & Plan:   Problem List Items Addressed This Visit   None Visit Diagnoses     Inguinal hernia of right side without obstruction or gangrene    -  Primary   Relevant Orders   Ambulatory referral to General Surgery       1. Inguinal hernia of right side without obstruction or gangrene -Right-sided inguinal hernia visible on chaperoned exam in office. - This hernia is not obstructed - Precautions discussed with patient about avoiding heavy lifting and strenuous activity - Referral to general surgery - He knows to present to the emergency department for any red flag symptoms as discussed such as severe pain or swelling.   This note was prepared  with assistance of Systems analyst. Occasional wrong-word or sound-a-like substitutions may have occurred due to the inherent limitations of voice recognition software.  Time Spent: 21 minutes of total time was spent on the date of the encounter performing the following actions: chart review prior to seeing the patient, obtaining history, performing a medically necessary exam, counseling on the treatment plan, placing orders, and documenting in our EHR.    Akshaj Besancon M Jame Seelig, PA-C

## 2021-01-30 NOTE — Patient Instructions (Signed)
Referral to general surgery Avoid lifting more than 10 lbs No pushing or pulling motions Straight to ED if any sudden severe pain or swelling

## 2021-02-11 ENCOUNTER — Telehealth: Payer: Self-pay

## 2021-02-11 ENCOUNTER — Telehealth (INDEPENDENT_AMBULATORY_CARE_PROVIDER_SITE_OTHER): Payer: 59 | Admitting: Family Medicine

## 2021-02-11 ENCOUNTER — Encounter: Payer: Self-pay | Admitting: Family Medicine

## 2021-02-11 VITALS — Ht 76.0 in | Wt 205.0 lb

## 2021-02-11 DIAGNOSIS — F909 Attention-deficit hyperactivity disorder, unspecified type: Secondary | ICD-10-CM

## 2021-02-11 DIAGNOSIS — K4090 Unilateral inguinal hernia, without obstruction or gangrene, not specified as recurrent: Secondary | ICD-10-CM | POA: Diagnosis not present

## 2021-02-11 DIAGNOSIS — G4700 Insomnia, unspecified: Secondary | ICD-10-CM

## 2021-02-11 NOTE — Telephone Encounter (Signed)
Error

## 2021-02-11 NOTE — Assessment & Plan Note (Signed)
Doing well on Ambien 10 mg nightly as needed.

## 2021-02-11 NOTE — Assessment & Plan Note (Signed)
Doing well on Ritalin 20 mg 3 times daily.  Medications help with ability to stay focused and on task.

## 2021-02-11 NOTE — Progress Notes (Signed)
   Danny Hurst is a 45 y.o. male who presents today for a virtual office visit.  Assessment/Plan:  New/Acute Problems: Inguinal Hernia No red flags or signs of incarceration.  He will be following up with surgery next week.  We will give letter stating he should stay out of work for the next 2 weeks.  Advised patient he should discuss with his surgeon about return to work protocol.  Also advised patient to contact his HR department to discuss possible FMLA.  Chronic Problems Addressed Today: Insomnia Doing well on Ambien 10 mg nightly as needed.  Attention deficit hyperactivity disorder (ADHD) Doing well on Ritalin 20 mg 3 times daily.  Medications help with ability to stay focused and on task.     Subjective:  HPI:  See A/P for status of chronic conditions.  He was recently diagnosed with inguinal hernia couple weeks ago.  He has not yet seen a Psychologist, sport and exercise.  Pain seems to be worsening.  He is concerned about exacerbating the hernia and work.  He works a very active job at [employer].  Does a lot of heavy lifting.       Objective/Observations  Physical Exam: Gen: NAD, resting comfortably Pulm: Normal work of breathing Neuro: Grossly normal, moves all extremities Psych: Normal affect and thought content  Virtual Visit via Video   I connected with Mariel Craft on 02/11/21 at  4:00 PM EDT by a video enabled telemedicine application and verified that I am speaking with the correct person using two identifiers. The limitations of evaluation and management by telemedicine and the availability of in person appointments were discussed. The patient expressed understanding and agreed to proceed.   Patient location: Home Provider location: [place] participating in the virtual visit: Myself and Patient     Algis Greenhouse. Jerline Pain, MD 02/11/2021 4:01 PM

## 2021-02-25 ENCOUNTER — Other Ambulatory Visit: Payer: Self-pay | Admitting: Family Medicine

## 2021-02-25 MED ORDER — METHYLPHENIDATE HCL 20 MG PO TABS: 20.0000 mg | ORAL_TABLET | Freq: Three times a day (TID) | ORAL | 0 refills | Status: DC

## 2021-02-28 ENCOUNTER — Telehealth: Payer: Self-pay

## 2021-02-28 NOTE — Telephone Encounter (Signed)
Pt's wife called stating that Danny Hurst is scheduled for hernia surgery in January. She stated that he cannot wait that long because they have not met the deductible for their daughter. She would like to know if he can be referred somewhere else. Please Advise.

## 2021-03-07 ENCOUNTER — Telehealth: Payer: 59 | Admitting: Nurse Practitioner

## 2021-03-07 DIAGNOSIS — Z20828 Contact with and (suspected) exposure to other viral communicable diseases: Secondary | ICD-10-CM | POA: Diagnosis not present

## 2021-03-07 DIAGNOSIS — R6889 Other general symptoms and signs: Secondary | ICD-10-CM

## 2021-03-07 MED ORDER — OSELTAMIVIR PHOSPHATE 75 MG PO CAPS: 75.0000 mg | ORAL_CAPSULE | Freq: Two times a day (BID) | ORAL | 0 refills | Status: DC

## 2021-03-07 NOTE — Patient Instructions (Signed)
Influenza, Adult °Influenza is also called "the flu." It is an infection in the lungs, nose, and throat (respiratory tract). It spreads easily from person to person (is contagious). The flu causes symptoms that are like a cold, along with high fever and body aches. °What are the causes? °This condition is caused by the influenza virus. You can get the virus by: °Breathing in droplets that are in the air after a person infected with the flu coughed or sneezed. °Touching something that has the virus on it and then touching your mouth, nose, or eyes. °What increases the risk? °Certain things may make you more likely to get the flu. These include: °Not washing your hands often. °Having close contact with many people during cold and flu season. °Touching your mouth, eyes, or nose without first washing your hands. °Not getting a flu shot every year. °You may have a higher risk for the flu, and serious problems, such as a lung infection (pneumonia), if you: °Are older than 65. °Are pregnant. °Have a weakened disease-fighting system (immune system) because of a disease or because you are taking certain medicines. °Have a long-term (chronic) condition, such as: °Heart, kidney, or lung disease. °Diabetes. °Asthma. °Have a liver disorder. °Are very overweight (morbidly obese). °Have anemia. °What are the signs or symptoms? °Symptoms usually begin suddenly and last 4-14 days. They may include: °Fever and chills. °Headaches, body aches, or muscle aches. °Sore throat. °Cough. °Runny or stuffy (congested) nose. °Feeling discomfort in your chest. °Not wanting to eat as much as normal. °Feeling weak or tired. °Feeling dizzy. °Feeling sick to your stomach or throwing up. °How is this treated? °If the flu is found early, you can be treated with antiviral medicine. This can help to reduce how bad the illness is and how long it lasts. This may be given by mouth or through an IV tube. °Taking care of yourself at home can help your  symptoms get better. Your doctor may want you to: °Take over-the-counter medicines. °Drink plenty of fluids. °The flu often goes away on its own. If you have very bad symptoms or other problems, you may be treated in a hospital. °Follow these instructions at home: °  °Activity °Rest as needed. Get plenty of sleep. °Stay home from work or school as told by your doctor. °Do not leave home until you do not have a fever for 24 hours without taking medicine. °Leave home only to go to your doctor. °Eating and drinking °Take an ORS (oral rehydration solution). This is a drink that is sold at pharmacies and stores. °Drink enough fluid to keep your pee pale yellow. °Drink clear fluids in small amounts as you are able. Clear fluids include: °Water. °Ice chips. °Fruit juice mixed with water. °Low-calorie sports drinks. °Eat bland foods that are easy to digest. Eat small amounts as you are able. These foods include: °Bananas. °Applesauce. °Rice. °Lean meats. °Toast. °Crackers. °Do not eat or drink: °Fluids that have a lot of sugar or caffeine. °Alcohol. °Spicy or fatty foods. °General instructions °Take over-the-counter and prescription medicines only as told by your doctor. °Use a cool mist humidifier to add moisture to the air in your home. This can make it easier for you to breathe. °When using a cool mist humidifier, clean it daily. Empty water and replace with clean water. °Cover your mouth and nose when you cough or sneeze. °Wash your hands with soap and water often and for at least 20 seconds. This is also important after   you cough or sneeze. If you cannot use soap and water, use alcohol-based hand sanitizer. °Keep all follow-up visits. °How is this prevented? ° °Get a flu shot every year. You may get the flu shot in late summer, fall, or winter. Ask your doctor when you should get your flu shot. °Avoid contact with people who are sick during fall and winter. This is cold and flu season. °Contact a doctor if: °You get  new symptoms. °You have: °Chest pain. °Watery poop (diarrhea). °A fever. °Your cough gets worse. °You start to have more mucus. °You feel sick to your stomach. °You throw up. °Get help right away if you: °Have shortness of breath. °Have trouble breathing. °Have skin or nails that turn a bluish color. °Have very bad pain or stiffness in your neck. °Get a sudden headache. °Get sudden pain in your face or ear. °Cannot eat or drink without throwing up. °These symptoms may represent a serious problem that is an emergency. Get medical help right away. Call your local emergency services (911 in the [place] °Do not wait to see if the symptoms will go away. °Do not drive yourself to the hospital. °Summary °Influenza is also called "the flu." It is an infection in the lungs, nose, and throat. It spreads easily from person to person. °Take over-the-counter and prescription medicines only as told by your doctor. °Getting a flu shot every year is the best way to not get the flu. °This information is not intended to replace advice given to you by your health care provider. Make sure you discuss any questions you have with your health care provider. °Document Revised: 12/08/2019 Document Reviewed: 12/08/2019 °Elsevier Patient Education © 2022 [employer] ° °

## 2021-03-07 NOTE — Progress Notes (Signed)
Virtual Visit Consent   Danny Hurst, you are scheduled for a virtual visit with Mary-Margaret Hassell Done, [place], a [facility] provider, today.     Just as with appointments in the office, your consent must be obtained to participate.  Your consent will be active for this visit and any virtual visit you may have with one of our providers in the next 365 days.     If you have a MyChart account, a copy of this consent can be sent to you electronically.  All virtual visits are billed to your insurance company just like a traditional visit in the office.    As this is a virtual visit, video technology does not allow for your provider to perform a traditional examination.  This may limit your provider's ability to fully assess your condition.  If your provider identifies any concerns that need to be evaluated in person or the need to arrange testing (such as labs, EKG, etc.), we will make arrangements to do so.     Although advances in technology are sophisticated, we cannot ensure that it will always work on either your end or our end.  If the connection with a video visit is poor, the visit may have to be switched to a telephone visit.  With either a video or telephone visit, we are not always able to ensure that we have a secure connection.     I need to obtain your verbal consent now.   Are you willing to proceed with your visit today? YES   Danny Hurst has provided verbal consent on 03/07/2021 for a virtual visit (video or telephone).   Mary-Margaret Hassell Done, FNP   Date: 03/07/2021 6:20 PM   Virtual Visit via Video Note   I, Mary-Margaret Hassell Done, connected with Danny Hurst, Danny Hurst, 1977) on 03/07/21 at  6:30 PM EDT by a video-enabled telemedicine application and verified that I am speaking with the correct person using two identifiers.  Location: Patient: Virtual Visit Location Patient: Home Provider: Virtual Visit Location Provider: Mobile   I discussed the limitations of  evaluation and management by telemedicine and the availability of in person appointments. The patient expressed understanding and agreed to proceed.    History of Present Illness: Danny Hurst is a 45 y.o. who identifies as a male who was assigned male at birth, and is being seen today for flu.  HPI: Patient states that his daughter tested positive for flu yesterday. He developed a fever and body aches earlier today. Now he is starting to cough .  Review of Systems  Constitutional:  Positive for chills and malaise/fatigue. Negative for fever.  HENT:  Positive for congestion.   Respiratory:  Positive for cough. Negative for shortness of breath.   Musculoskeletal:  Positive for myalgias.  Neurological:  Negative for dizziness and headaches.   Problems:  Patient Active Problem List   Diagnosis Date Noted   Attention deficit hyperactivity disorder (ADHD) 12/16/2018   Insomnia 12/16/2018    Allergies: No Known Allergies Medications:  Current Outpatient Medications:    methylphenidate (RITALIN) 20 MG tablet, Take 1 tablet (20 mg total) by mouth 3 (three) times daily with meals., Disp: 90 tablet, Rfl: 0   methylphenidate (RITALIN) 20 MG tablet, Take 1 tablet (20 mg total) by mouth 3 (three) times daily with meals., Disp: 90 tablet, Rfl: 0   methylphenidate (RITALIN) 20 MG tablet, Take 1 tablet (20 mg total) by mouth 3 (three) times daily with meals., Disp:  90 tablet, Rfl: 0   zolpidem (AMBIEN) 10 MG tablet, TK 1 T PO QHS PRN, Disp: 30 tablet, Rfl: 5  Observations/Objective: Patient is well-developed, well-nourished in no acute distress.  Resting comfortably  at home.  Head is normocephalic, atraumatic.  No labored breathing.  Speech is clear and coherent with logical content.  Patient is alert and oriented at baseline.  Slight cough  Assessment and Plan:  Danny Hurst in today with chief complaint of Influenza   1. Flu-like symptoms 2. Exposure to the flu 1. Take meds as  prescribed 2. Use a cool mist humidifier especially during the winter months and when heat has been humid. 3. Use saline nose sprays frequently 4. Saline irrigations of the nose can be very helpful if done frequently.  * 4X daily for 1 week*  * Use of a nettie pot can be helpful with this. Follow directions with this* 5. Drink plenty of fluids 6. Keep thermostat turn down low 7.For any cough or congestion  Use plain Mucinex- regular strength or max strength is fine   * Children- consult with Pharmacist for dosing 8. For fever or aces or pains- take tylenol or ibuprofen appropriate for age and weight.  * for fevers greater than 101 orally you may alternate ibuprofen and tylenol every  3 hours.   Quarantine till 24 hours symptom free Meds ordered this encounter  Medications   oseltamivir (TAMIFLU) 75 MG capsule    Sig: Take 1 capsule (75 mg total) by mouth 2 (two) times daily.    Dispense:  10 capsule    Refill:  0    Order Specific Question:   Supervising Provider    Answer:   Noemi Chapel [[id number]]      Follow Up Instructions: I discussed the assessment and treatment plan with the patient. The patient was provided an opportunity to ask questions and all were answered. The patient agreed with the plan and demonstrated an understanding of the instructions.  A copy of instructions were sent to the patient via MyChart.  The patient was advised to call back or seek an in-person evaluation if the symptoms worsen or if the condition fails to improve as anticipated.  Time:  I spent 10 minutes with the patient via telehealth technology discussing the above problems/concerns.    Mary-Margaret Hassell Done, FNP

## 2021-03-11 ENCOUNTER — Ambulatory Visit: Payer: 59 | Admitting: Surgery

## 2021-03-14 ENCOUNTER — Telehealth: Payer: Self-pay

## 2021-03-14 NOTE — Telephone Encounter (Signed)
Received a call from [employer] they are calling for more information about the patients claim, such as diagnosis codes. Asked for a call returned phone call.

## 2021-03-18 NOTE — Telephone Encounter (Signed)
Howell Rucks has sent over forms.  I have placed in folder up front.

## 2021-03-20 NOTE — Telephone Encounter (Signed)
Form recived  Called patient. LVM patient need OV

## 2021-03-24 ENCOUNTER — Ambulatory Visit: Payer: 59 | Admitting: Family Medicine

## 2021-03-24 ENCOUNTER — Other Ambulatory Visit: Payer: Self-pay

## 2021-03-24 ENCOUNTER — Encounter: Payer: Self-pay | Admitting: Family Medicine

## 2021-03-24 VITALS — BP 123/79 | HR 61 | Temp 98.0°F | Ht 76.0 in | Wt 195.0 lb

## 2021-03-24 DIAGNOSIS — K4020 Bilateral inguinal hernia, without obstruction or gangrene, not specified as recurrent: Secondary | ICD-10-CM | POA: Diagnosis not present

## 2021-03-24 DIAGNOSIS — F909 Attention-deficit hyperactivity disorder, unspecified type: Secondary | ICD-10-CM | POA: Diagnosis not present

## 2021-03-24 DIAGNOSIS — G4700 Insomnia, unspecified: Secondary | ICD-10-CM

## 2021-03-24 NOTE — Patient Instructions (Signed)
It was very nice to see you today!  We will complete your form for work today.  We will see back in about 5 to 6 months.  Please come in to see sooner if needed.  Take care, Dr Jerline Pain  PLEASE NOTE:  If you had any lab tests please let us know if you have not heard back within a few days. You may see your results on mychart before we have a chance to review them but we will give you a call once they are reviewed by [place]. If we ordered any referrals today, please let us know if you have not heard from their office within the next week.   Please try these tips to maintain a healthy lifestyle:  Eat at least 3 REAL meals and 1-2 snacks per day.  Aim for no more than 5 hours between eating.  If you eat breakfast, please do so within one hour of getting up.   Each meal should contain half fruits/vegetables, one quarter protein, and one quarter carbs (no bigger than a computer mouse)  Cut down on sweet beverages. This includes juice, soda, and sweet tea.   Drink at least 1 glass of water with each meal and aim for at least 8 glasses per day  Exercise at least 150 minutes every week.

## 2021-03-24 NOTE — Assessment & Plan Note (Signed)
Doing well with Ritalin 20 mg 3 times daily.  Medication helps with his ability to stay focused and on task.

## 2021-03-24 NOTE — Assessment & Plan Note (Addendum)
Stable on Ambien 10 mg nightly as needed. No side effects.

## 2021-03-24 NOTE — Progress Notes (Signed)
   Danny Hurst is a 45 y.o. male who presents today for an office visit.  Assessment/Plan:  New/Acute Problems: Bilateral Inguinal Hernia Pain is relatively well controlled at this point.  He has modified his activities at work and has been avoiding lifting any heavy objects.  Will be having surgery performed hopefully in the next couple of months.  We will complete his requested work form today stating his work restrictions and reasons for being absent.  We will defer further management to his surgeon.  Chronic Problems Addressed Today: Attention deficit hyperactivity disorder (ADHD) Doing well with Ritalin 20 mg 3 times daily.  Medication helps with his ability to stay focused and on task.  Insomnia Stable on Ambien 10 mg nightly as needed. No side effects.      Subjective:  HPI:  Patient here for inguinal hernia follow up. This was diagnosed on 01/31/2020.  For about a month preceding this he had noticed increasing lump in the area.  He attributed this due to his recent vasectomy. Over time he noticed that the area has been increasing in size over time, stating that it has been uncomfortable. He describes the pain as being similar to "gas pain", where it can appear and go away.  He was referred to surgeon who confirmed bilateral inguinal hernias.  At the time of visit this would diagnosis he was recommended to avoid lifting anything over 10 pounds.  This has made his work difficult as there are not any light duty positions.  Is out of work for about a month from working since October 10th, to November 7th.  He has returned to work however he has some assistance with his team.  He has not been doing any heavy lifting.  However, his capacity at work is still significantly limited by the hernias.       Objective:  Physical Exam: BP 123/79   Pulse 61   Temp 98 F (36.7 C) (Temporal)   Ht 6\' 4"  (1.93 m)   Wt 195 lb (88.5 kg)   SpO2 95%   BMI 23.74 kg/m   Gen: No acute distress,  resting comfortably CV: Regular rate and rhythm with no murmurs appreciated Pulm: Normal work of breathing, clear to auscultation bilaterally with no crackles, wheezes, or rhonchi Neuro: Grossly normal, moves all extremities Psych: Normal affect and thought content      I,Jordan Kelly,acting as a scribe for Dimas Chyle, MD.,have documented all relevant documentation on the behalf of Dimas Chyle, MD,as directed by  Dimas Chyle, MD while in the presence of Dimas Chyle, MD.  I, Dimas Chyle, MD, have reviewed all documentation for this visit. The documentation on 03/24/21 for the exam, diagnosis, procedures, and orders are all accurate and complete.  Time Spent: 45 minutes of total time was spent on the date of the encounter performing the following actions: chart review prior to seeing the patient, obtaining history, performing a medically necessary exam, completing his requested paperwork for work counseling on the treatment plan, placing orders, and documenting in our EHR.    Algis Greenhouse. Jerline Pain, MD 03/24/2021 2:28 PM

## 2021-03-25 ENCOUNTER — Telehealth: Payer: Self-pay | Admitting: *Deleted

## 2021-03-25 NOTE — Telephone Encounter (Signed)
Disability form faxed to [phone]

## 2021-03-26 ENCOUNTER — Other Ambulatory Visit: Payer: Self-pay | Admitting: Family Medicine

## 2021-03-26 MED ORDER — METHYLPHENIDATE HCL 20 MG PO TABS: 20.0000 mg | ORAL_TABLET | Freq: Three times a day (TID) | ORAL | 0 refills | Status: DC

## 2021-04-24 ENCOUNTER — Other Ambulatory Visit: Payer: Self-pay | Admitting: Family Medicine

## 2021-04-29 NOTE — Telephone Encounter (Signed)
pt contacted to check status of med refill.   methylphenidate (RITALIN) 20 MG tablet [[id number], [place] [address]  Phone:  [phone]  Fax:  [phone]

## 2021-04-30 MED ORDER — METHYLPHENIDATE HCL 20 MG PO TABS: 20.0000 mg | ORAL_TABLET | Freq: Three times a day (TID) | ORAL | 0 refills | Status: DC

## 2021-05-04 HISTORY — PX: HERNIA REPAIR: SHX51

## 2021-05-30 ENCOUNTER — Other Ambulatory Visit: Payer: Self-pay | Admitting: Family Medicine

## 2021-05-30 MED ORDER — METHYLPHENIDATE HCL 20 MG PO TABS: 20.0000 mg | ORAL_TABLET | Freq: Three times a day (TID) | ORAL | 0 refills | Status: DC

## 2021-06-11 ENCOUNTER — Encounter: Payer: Self-pay | Admitting: Physician Assistant

## 2021-06-11 ENCOUNTER — Other Ambulatory Visit: Payer: Self-pay

## 2021-06-11 ENCOUNTER — Ambulatory Visit: Payer: 59 | Admitting: Physician Assistant

## 2021-06-11 VITALS — BP 137/92 | HR 67 | Temp 98.4°F | Ht 76.0 in | Wt 204.2 lb

## 2021-06-11 DIAGNOSIS — K4020 Bilateral inguinal hernia, without obstruction or gangrene, not specified as recurrent: Secondary | ICD-10-CM

## 2021-06-11 NOTE — Progress Notes (Signed)
Subjective:    Patient ID: Danny Hurst, male    DOB: 06/22/1975, 46 y.o.   MRN: [id number]  Chief Complaint  Patient presents with   Hernia    HPI Patient is in today for hernia recheck.   He was supposed to have surgery yesterday at 6 am with [facility]. They canceled him last minute at 4 am, and rescheduled him to August 08, 2021.  Now patient would like to try to see [facility] general surgery if he could have this done sooner as it is affecting his daily life. He is used to staying very active. 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   Continual pulling pain on the right. Seems to be larger than before, but denies any bowel changes. No nausea or vomiting. No fever. No redness.    Past Medical History:  Diagnosis Date   ADHD    Broken arm    Insomnia     Past Surgical History:  Procedure Laterality Date   VASECTOMY      Family History  Problem Relation Age of Onset   Diabetes type I Daughter    Heart disease Neg Hx    Colon cancer Neg Hx    Prostate cancer Neg Hx     Social History   Tobacco Use   Smoking status: Former    Years: 28.00    Types: Cigarettes    Start date: 1995    Quit date: 2013    Years since quitting: 10.1   Smokeless tobacco: Former     No Known Allergies  Review of Systems NEGATIVE UNLESS OTHERWISE INDICATED IN HPI      Objective:     BP (!) 137/92    Pulse 67    Temp 98.4 F (36.9 C)    Ht 6\' 4"  (1.93 m)    Wt 204 lb 3.2 oz (92.6 kg)    SpO2 98%    BMI 24.86 kg/m   Wt Readings from Last 3 Encounters:  06/11/21 204 lb 3.2 oz (92.6 kg)  03/24/21 195 lb (88.5 kg)  02/11/21 205 lb (93 kg)    BP Readings from Last 3 Encounters:  06/11/21 (!) 137/92  03/24/21 123/79  01/30/21 126/75     Physical Exam Vitals and nursing note reviewed. Exam conducted with a chaperone present.  Constitutional:      Appearance: Normal appearance.  Cardiovascular:     Rate and Rhythm: Normal rate and regular  rhythm.     Pulses: Normal pulses.     Heart sounds: No murmur heard. Pulmonary:     Effort: Pulmonary effort is normal.     Breath sounds: Normal breath sounds.  Abdominal:     Comments: Deferred exam as no history c/w obstruction present.  Neurological:     Mental Status: He is alert.  Psychiatric:        Mood and Affect: Mood normal.       Assessment & Plan:   Problem List Items Addressed This Visit   None Visit Diagnoses     Bilateral inguinal hernia without obstruction or gangrene, recurrence not specified    -  Primary   Relevant Orders   Ambulatory referral to General Surgery       1. Bilateral inguinal hernia without obstruction or gangrene, recurrence not specified -Referral to [facility] gen surg, advised pt to call for appt as well -No red flags - he knows to go to ED in the event of  any -No heavy lifting. -OOW until surgery, note provided today    Time Spent: 20 minutes of total time was spent on the date of the encounter performing the following actions: chart review prior to seeing the patient, obtaining history, performing a medically necessary exam, counseling on the treatment plan, placing orders, and documenting in our EHR.    Murial Beam M Abeera Flannery, PA-C

## 2021-06-29 ENCOUNTER — Other Ambulatory Visit: Payer: Self-pay | Admitting: Family Medicine

## 2021-06-30 ENCOUNTER — Telehealth: Payer: Self-pay | Admitting: Family Medicine

## 2021-06-30 MED ORDER — METHYLPHENIDATE HCL 20 MG PO TABS: 20.0000 mg | ORAL_TABLET | Freq: Three times a day (TID) | ORAL | 0 refills | Status: DC

## 2021-06-30 NOTE — Telephone Encounter (Signed)
Pt wants to know if disability forms have been faxed.

## 2021-06-30 NOTE — Telephone Encounter (Signed)
Form Faxed--

## 2021-07-30 ENCOUNTER — Other Ambulatory Visit: Payer: Self-pay | Admitting: Family Medicine

## 2021-07-31 MED ORDER — METHYLPHENIDATE HCL 20 MG PO TABS: 20.0000 mg | ORAL_TABLET | Freq: Three times a day (TID) | ORAL | 0 refills | Status: DC

## 2021-07-31 NOTE — Telephone Encounter (Signed)
Please let patient know he needs appointment for further refills.  ?

## 2021-09-01 ENCOUNTER — Other Ambulatory Visit: Payer: Self-pay | Admitting: Family Medicine

## 2021-09-01 MED ORDER — METHYLPHENIDATE HCL 20 MG PO TABS: 20.0000 mg | ORAL_TABLET | Freq: Three times a day (TID) | ORAL | 0 refills | Status: DC

## 2021-09-01 NOTE — Telephone Encounter (Signed)
Last visit: 06/11/21 ? ?Next visit:  none ? ?Last filled: 07/31/21 ? ?Quantity: 90  ? ? ?

## 2021-09-30 ENCOUNTER — Other Ambulatory Visit: Payer: Self-pay | Admitting: Family Medicine

## 2021-10-02 ENCOUNTER — Telehealth (INDEPENDENT_AMBULATORY_CARE_PROVIDER_SITE_OTHER): Payer: 59 | Admitting: Family Medicine

## 2021-10-02 ENCOUNTER — Encounter: Payer: Self-pay | Admitting: Family Medicine

## 2021-10-02 DIAGNOSIS — Z1211 Encounter for screening for malignant neoplasm of colon: Secondary | ICD-10-CM

## 2021-10-02 DIAGNOSIS — F909 Attention-deficit hyperactivity disorder, unspecified type: Secondary | ICD-10-CM

## 2021-10-02 DIAGNOSIS — G4700 Insomnia, unspecified: Secondary | ICD-10-CM

## 2021-10-02 MED ORDER — METHYLPHENIDATE HCL 20 MG PO TABS: 20.0000 mg | ORAL_TABLET | Freq: Three times a day (TID) | ORAL | 0 refills | Status: DC

## 2021-10-02 NOTE — Assessment & Plan Note (Signed)
Symptoms are stable.  He has not used Ambien in a while and does not think he will need it for the foreseeable future.

## 2021-10-02 NOTE — Assessment & Plan Note (Signed)
Doing well on Ritalin 3 mg 3 times daily.  Database without red flags.  Medications help with ability stay focused on task.  No significant side effects.  He will follow-up in 6 months for CPE and we can do medication recheck at that time.

## 2021-10-02 NOTE — Progress Notes (Signed)
   Danny Hurst is a 46 y.o. male who presents today for a virtual office visit.  Assessment/Plan:  Chronic Problems Addressed Today: Attention deficit hyperactivity disorder (ADHD) Doing well on Ritalin 3 mg 3 times daily.  Database without red flags.  Medications help with ability stay focused on task.  No significant side effects.  He will follow-up in 6 months for CPE and we can do medication recheck at that time.  Insomnia Symptoms are stable.  He has not used Ambien in a while and does not think he will need it for the foreseeable future.     Subjective:  HPI:  See A/P for status of chronic conditions.       Objective/Observations  Physical Exam: Gen: NAD, resting comfortably Pulm: Normal work of breathing Neuro: Grossly normal, moves all extremities Psych: Normal affect and thought content  Virtual Visit via Video   I connected with Danny Hurst on 10/02/21 at  9:40 AM EDT by a video enabled telemedicine application and verified that I am speaking with the correct person using two identifiers. The limitations of evaluation and management by telemedicine and the availability of in person appointments were discussed. The patient expressed understanding and agreed to proceed.   Patient location: Home Provider location: [place] participating in the virtual visit: Myself and Patient     Algis Greenhouse. Jerline Pain, MD 10/02/2021 10:24 AM

## 2021-10-31 ENCOUNTER — Other Ambulatory Visit: Payer: Self-pay | Admitting: Family Medicine

## 2021-11-03 MED ORDER — METHYLPHENIDATE HCL 20 MG PO TABS: 20.0000 mg | ORAL_TABLET | Freq: Three times a day (TID) | ORAL | 0 refills | Status: DC

## 2021-12-01 ENCOUNTER — Other Ambulatory Visit: Payer: Self-pay | Admitting: Family Medicine

## 2021-12-01 MED ORDER — METHYLPHENIDATE HCL 20 MG PO TABS: 20.0000 mg | ORAL_TABLET | Freq: Three times a day (TID) | ORAL | 0 refills | Status: DC

## 2021-12-01 NOTE — Telephone Encounter (Signed)
Pt requesting refill for Ritalin 20 mg. Last OV 10/02/2021.

## 2022-01-01 ENCOUNTER — Other Ambulatory Visit: Payer: Self-pay | Admitting: Family Medicine

## 2022-01-01 MED ORDER — METHYLPHENIDATE HCL 20 MG PO TABS: 20.0000 mg | ORAL_TABLET | Freq: Three times a day (TID) | ORAL | 0 refills | Status: DC

## 2022-01-21 ENCOUNTER — Encounter: Payer: Self-pay | Admitting: Family Medicine

## 2022-01-26 ENCOUNTER — Encounter: Payer: Self-pay | Admitting: *Deleted

## 2022-02-02 ENCOUNTER — Other Ambulatory Visit: Payer: Self-pay | Admitting: Family Medicine

## 2022-02-02 NOTE — Telephone Encounter (Signed)
Last refill: 01/01/22#90, 0  Last OV: 10/02/21 dx. ADHD

## 2022-02-03 MED ORDER — METHYLPHENIDATE HCL 20 MG PO TABS: 20.0000 mg | ORAL_TABLET | Freq: Three times a day (TID) | ORAL | 0 refills | Status: DC

## 2022-03-06 ENCOUNTER — Other Ambulatory Visit: Payer: Self-pay | Admitting: Family Medicine

## 2022-03-09 MED ORDER — METHYLPHENIDATE HCL 20 MG PO TABS: 20.0000 mg | ORAL_TABLET | Freq: Three times a day (TID) | ORAL | 0 refills | Status: DC

## 2022-03-10 ENCOUNTER — Ambulatory Visit: Payer: 59 | Admitting: Family Medicine

## 2022-03-10 ENCOUNTER — Encounter: Payer: Self-pay | Admitting: Family Medicine

## 2022-03-10 VITALS — BP 125/80 | HR 77 | Temp 98.4°F | Ht 76.0 in | Wt 222.8 lb

## 2022-03-10 DIAGNOSIS — F909 Attention-deficit hyperactivity disorder, unspecified type: Secondary | ICD-10-CM | POA: Diagnosis not present

## 2022-03-10 DIAGNOSIS — Z23 Encounter for immunization: Secondary | ICD-10-CM

## 2022-03-10 DIAGNOSIS — G4700 Insomnia, unspecified: Secondary | ICD-10-CM

## 2022-03-10 DIAGNOSIS — Z1211 Encounter for screening for malignant neoplasm of colon: Secondary | ICD-10-CM | POA: Diagnosis not present

## 2022-03-10 MED ORDER — METHYLPHENIDATE HCL 20 MG PO TABS: 20.0000 mg | ORAL_TABLET | Freq: Three times a day (TID) | ORAL | 0 refills | Status: DC

## 2022-03-10 NOTE — Assessment & Plan Note (Signed)
Overall stable.  Has not used Ambien for a while.

## 2022-03-10 NOTE — Assessment & Plan Note (Signed)
Stable on Ritalin 20 mg 3 times daily database was unable to be reviewed today.  He is tolerating well.  Medications help with ability stay focused and on task.  No significant side effects.  Follow-up in 6 months.

## 2022-03-10 NOTE — Progress Notes (Signed)
   Danny Hurst is a 46 y.o. male who presents today for an office visit.  Assessment/Plan:  Chronic Problems Addressed Today: Attention deficit hyperactivity disorder (ADHD) Stable on Ritalin 20 mg 3 times daily database was unable to be reviewed today.  He is tolerating well.  Medications help with ability stay focused and on task.  No significant side effects.  Follow-up in 6 months.  Insomnia Overall stable.  Has not used Ambien for a while.  Tdap given today.     Subjective:  HPI:  See A/p for status of chronic conditions.         Objective:  Physical Exam: BP 125/80   Pulse 77   Temp 98.4 F (36.9 C) (Temporal)   Ht '6\' 4"'$  (1.93 m)   Wt 222 lb 12.8 oz (101.1 kg)   SpO2 96%   BMI 27.12 kg/m   Gen: No acute distress, resting comfortably CV: Regular rate and rhythm with no murmurs appreciated Pulm: Normal work of breathing, clear to auscultation bilaterally with no crackles, wheezes, or rhonchi Neuro: Grossly normal, moves all extremities Psych: Normal affect and thought content      Anne-Marie Genson M. Jerline Pain, MD 03/10/2022 3:07 PM

## 2022-03-10 NOTE — Patient Instructions (Signed)
It was very nice to see you today!  I will refill your medications today.  We will give your tetanus vaccine today.  We will see you back in 6 months.  Please come back to see [place] sooner if needed.  Take care, Dr Jerline Pain  PLEASE NOTE:  If you had any lab tests please let us know if you have not heard back within a few days. You may see your results on mychart before we have a chance to review them but we will give you a call once they are reviewed by [place]. If we ordered any referrals today, please let us know if you have not heard from their office within the next week.   Please try these tips to maintain a healthy lifestyle:  Eat at least 3 REAL meals and 1-2 snacks per day.  Aim for no more than 5 hours between eating.  If you eat breakfast, please do so within one hour of getting up.   Each meal should contain half fruits/vegetables, one quarter protein, and one quarter carbs (no bigger than a computer mouse)  Cut down on sweet beverages. This includes juice, soda, and sweet tea.   Drink at least 1 glass of water with each meal and aim for at least 8 glasses per day  Exercise at least 150 minutes every week.

## 2022-03-11 ENCOUNTER — Encounter: Payer: Self-pay | Admitting: Gastroenterology

## 2022-03-24 ENCOUNTER — Other Ambulatory Visit: Payer: Self-pay

## 2022-03-24 ENCOUNTER — Ambulatory Visit (AMBULATORY_SURGERY_CENTER): Payer: Self-pay | Admitting: *Deleted

## 2022-03-24 VITALS — Ht 76.0 in | Wt 210.0 lb

## 2022-03-24 DIAGNOSIS — Z1211 Encounter for screening for malignant neoplasm of colon: Secondary | ICD-10-CM

## 2022-03-24 MED ORDER — NA SULFATE-K SULFATE-MG SULF 17.5-3.13-1.6 GM/177ML PO SOLN: 1.0000 | Freq: Once | ORAL | 0 refills | Status: AC

## 2022-03-24 NOTE — Progress Notes (Signed)
Pre visit completed in person.  No egg or soy allergy known to patient  No issues known to pt with past sedation with any surgeries or procedures Patient denies ever being told they had issues or difficulty with intubation  No FH of Malignant Hyperthermia Pt is not on diet pills Pt is not on  home 02  Pt is not on blood thinners  Pt denies issues with constipation  No A fib or A flutter  Pt instructed to use Singlecare.com or GoodRx for a price reduction on prep   

## 2022-04-07 ENCOUNTER — Telehealth: Payer: Self-pay | Admitting: Family Medicine

## 2022-04-07 ENCOUNTER — Encounter: Payer: Self-pay | Admitting: Gastroenterology

## 2022-04-07 ENCOUNTER — Other Ambulatory Visit: Payer: Self-pay

## 2022-04-07 NOTE — Telephone Encounter (Signed)
Patient states he cannot get the following until 05/09/22-Patient states he is out of the following medication:   LAST APPOINTMENT DATE:  Please schedule appointment if longer than 1 year  03/10/22  NEXT APPOINTMENT DATE:  MEDICATION:  methylphenidate (RITALIN) 20 MG tablet   Is the patient out of medication? Yes  PHARMACY: [id number] [place], [place] - [address] Phone: [phone] Fax: [phone]      Let patient know to contact pharmacy at the end of the day to make sure medication is ready.  Please notify patient to allow 48-72 hours to process

## 2022-04-07 NOTE — Telephone Encounter (Signed)
Pharmacy has scripts on file, filling today

## 2022-04-07 NOTE — Addendum Note (Signed)
Addended by: Betti Cruz on: 04/07/2022 06:57 PM   Modules accepted: Orders

## 2022-04-07 NOTE — Telephone Encounter (Signed)
Error

## 2022-04-08 MED ORDER — METHYLPHENIDATE HCL 20 MG PO TABS: 20.0000 mg | ORAL_TABLET | Freq: Three times a day (TID) | ORAL | 0 refills | Status: DC

## 2022-04-09 ENCOUNTER — Other Ambulatory Visit: Payer: Self-pay

## 2022-04-09 NOTE — Telephone Encounter (Signed)
[facility]. Needs script sent to [facility], they have ritalin in stock.

## 2022-04-10 ENCOUNTER — Other Ambulatory Visit: Payer: Self-pay | Admitting: *Deleted

## 2022-04-10 ENCOUNTER — Ambulatory Visit (AMBULATORY_SURGERY_CENTER): Payer: 59 | Admitting: Gastroenterology

## 2022-04-10 ENCOUNTER — Encounter: Payer: Self-pay | Admitting: Gastroenterology

## 2022-04-10 ENCOUNTER — Telehealth: Payer: Self-pay

## 2022-04-10 VITALS — BP 112/77 | HR 53 | Temp 97.8°F | Resp 9 | Ht 76.0 in | Wt 208.6 lb

## 2022-04-10 DIAGNOSIS — K621 Rectal polyp: Secondary | ICD-10-CM

## 2022-04-10 DIAGNOSIS — D125 Benign neoplasm of sigmoid colon: Secondary | ICD-10-CM

## 2022-04-10 DIAGNOSIS — Z1211 Encounter for screening for malignant neoplasm of colon: Secondary | ICD-10-CM

## 2022-04-10 DIAGNOSIS — D128 Benign neoplasm of rectum: Secondary | ICD-10-CM

## 2022-04-10 DIAGNOSIS — K635 Polyp of colon: Secondary | ICD-10-CM

## 2022-04-10 MED ORDER — SODIUM CHLORIDE 0.9 % IV SOLN: 500.0000 mL | Freq: Once | INTRAVENOUS | Status: DC

## 2022-04-10 MED ORDER — METHYLPHENIDATE HCL 20 MG PO TABS: 20.0000 mg | ORAL_TABLET | Freq: Three times a day (TID) | ORAL | 0 refills | Status: DC

## 2022-04-10 NOTE — Telephone Encounter (Signed)
Can a new script for Ritalin be sent in without the 05/09/22 start date?

## 2022-04-10 NOTE — Progress Notes (Signed)
Pt's states no medical or surgical changes since previsit or office visit. 

## 2022-04-10 NOTE — Op Note (Signed)
[facility] Patient Name: Danny Hurst Procedure Date: 04/10/2022 3:31 PM MRN: [id number] Endoscopist: Nicki Reaper E. Candis Schatz , MD, [id number] Age: 46 Referring MD:  Date of Birth: [birth year] Gender: Male Account #: [id number] Procedure:                Colonoscopy Indications:              Screening for colorectal malignant neoplasm, This                            is the patient's first colonoscopy Medicines:                Monitored Anesthesia Care Procedure:                Pre-Anesthesia Assessment:                           - Prior to the procedure, a History and Physical                            was performed, and patient medications and                            allergies were reviewed. The patient's tolerance of                            previous anesthesia was also reviewed. The risks                            and benefits of the procedure and the sedation                            options and risks were discussed with the patient.                            All questions were answered, and informed consent                            was obtained. Prior Anticoagulants: The patient has                            taken no anticoagulant or antiplatelet agents. ASA                            Grade Assessment: II - A patient with mild systemic                            disease. After reviewing the risks and benefits,                            the patient was deemed in satisfactory condition to                            undergo the procedure.  After obtaining informed consent, the colonoscope                            was passed under direct vision. Throughout the                            procedure, the patient's blood pressure, pulse, and                            oxygen saturations were monitored continuously. The                            Olympus CF-HQ190L ([id number]) Colonoscope was                            introduced through the  anus and advanced to the the                            terminal ileum, with identification of the                            appendiceal orifice and IC valve. The colonoscopy                            was performed without difficulty. The patient                            tolerated the procedure well. The quality of the                            bowel preparation was good. The terminal ileum,                            ileocecal valve, appendiceal orifice, and rectum                            were photographed. The bowel preparation used was                            SUPREP via split dose instruction. Scope In: 3:39:06 PM Scope Out: 3:57:15 PM Scope Withdrawal Time: 0 hours 14 minutes 43 seconds  Total Procedure Duration: 0 hours 18 minutes 9 seconds  Findings:                 The perianal and digital rectal examinations were                            normal. Pertinent negatives include normal                            sphincter tone and no palpable rectal lesions.                           Two sessile and semi-pedunculated polyps were found  in the sigmoid colon. The polyps were 3 to 8 mm in                            size. These polyps were removed with a cold snare.                            Resection and retrieval were complete. Estimated                            blood loss was minimal.                           Two sessile polyps were found in the rectum. The                            polyps were 2 to 3 mm in size. These polyps were                            removed with a cold snare. Resection and retrieval                            were complete. Estimated blood loss was minimal.                           A few small-mouthed diverticula were found in the                            sigmoid colon.                           The exam was otherwise normal throughout the                            examined colon.                           The  terminal ileum appeared normal.                           The retroflexed view of the distal rectum and anal                            verge was normal and showed no anal or rectal                            abnormalities. Complications:            No immediate complications. Estimated Blood Loss:     Estimated blood loss was minimal. Impression:               - Two 3 to 8 mm polyps in the sigmoid colon,                            removed with a cold snare. Resected and retrieved.                           -  Two 2 to 3 mm polyps in the rectum, removed with                            a cold snare. Resected and retrieved.                           - Diverticulosis in the sigmoid colon.                           - The examined portion of the ileum was normal.                           - The distal rectum and anal verge are normal on                            retroflexion view. Recommendation:           - Patient has a contact number available for                            emergencies. The signs and symptoms of potential                            delayed complications were discussed with the                            patient. Return to normal activities tomorrow.                            Written discharge instructions were provided to the                            patient.                           - Resume previous diet.                           - Continue present medications.                           - Await pathology results.                           - Repeat colonoscopy (date not yet determined) for                            surveillance based on pathology results. Dorianna Mckiver E. Candis Schatz, MD 04/10/2022 4:03:59 PM This report has been signed electronically.

## 2022-04-10 NOTE — Telephone Encounter (Signed)
Sent in.  Algis Greenhouse. Jerline Pain, MD 04/10/2022 1:03 PM

## 2022-04-10 NOTE — Progress Notes (Signed)
Called to room to assist during endoscopic procedure.  Patient ID and intended procedure confirmed with present staff. Received instructions for my participation in the procedure from the performing physician.  

## 2022-04-10 NOTE — Patient Instructions (Addendum)
Patient has a contact number available for emergencies. The signs and symptoms of potential delayed complications were discussed with the patient. Return to normal activities tomorrow. Written discharge instructions were provided to the patient. - Resume previous diet. - Continue present medications. - Await pathology results. - Repeat colonoscopy (date not yet determined) for surveillance based on pathology results.  Handout on polyps provided.  YOU HAD AN ENDOSCOPIC PROCEDURE TODAY AT [facility]:   Refer to the procedure report that was given to you for any specific questions about what was found during the examination.  If the procedure report does not answer your questions, please call your gastroenterologist to clarify.  If you requested that your care partner not be given the details of your procedure findings, then the procedure report has been included in a sealed envelope for you to review at your convenience later.  YOU SHOULD EXPECT: Some feelings of bloating in the abdomen. Passage of more gas than usual.  Walking can help get rid of the air that was put into your GI tract during the procedure and reduce the bloating. If you had a lower endoscopy (such as a colonoscopy or flexible sigmoidoscopy) you may notice spotting of blood in your stool or on the toilet paper. If you underwent a bowel prep for your procedure, you may not have a normal bowel movement for a few days.  Please Note:  You might notice some irritation and congestion in your nose or some drainage.  This is from the oxygen used during your procedure.  There is no need for concern and it should clear up in a day or so.  SYMPTOMS TO REPORT IMMEDIATELY:  Following lower endoscopy (colonoscopy or flexible sigmoidoscopy):  Excessive amounts of blood in the stool  Significant tenderness or worsening of abdominal pains  Swelling of the abdomen that is new, acute  Fever of 100F or higher   For urgent or  emergent issues, a gastroenterologist can be reached at any hour by calling [phone]. Do not use MyChart messaging for urgent concerns.    DIET:  We do recommend a small meal at first, but then you may proceed to your regular diet.  Drink plenty of fluids but you should avoid alcoholic beverages for 24 hours.  ACTIVITY:  You should plan to take it easy for the rest of today and you should NOT DRIVE or use heavy machinery until tomorrow (because of the sedation medicines used during the test).    FOLLOW UP: Our staff will call the number listed on your records the next business day following your procedure.  We will call around 7:15- 8:00 am to check on you and address any questions or concerns that you may have regarding the information given to you following your procedure. If we do not reach you, we will leave a message.     If any biopsies were taken you will be contacted by phone or by letter within the next 1-3 weeks.  Please call us at [phone] if you have not heard about the biopsies in 3 weeks.    SIGNATURES/CONFIDENTIALITY: You and/or your care partner have signed paperwork which will be entered into your electronic medical record.  These signatures attest to the fact that that the information above on your After Visit Summary has been reviewed and is understood.  Full responsibility of the confidentiality of this discharge information lies with you and/or your care-partner.

## 2022-04-10 NOTE — Telephone Encounter (Signed)
LVM Rx send to [facility]

## 2022-04-10 NOTE — Telephone Encounter (Signed)
Please resend to [facility]

## 2022-04-10 NOTE — Progress Notes (Unsigned)
[name] History and Physical   Primary Care Physician:  Danny Barrack, MD   Reason for Procedure:   Colon cancer screening   Plan:    Screening colonoscopy     HPI: Danny Hurst is a 46 y.o. male undergoing initial average risk screening colonoscopy.  He has no family history of colon cancer and no chronic GI symptoms.    Past Medical History:  Diagnosis Date   ADHD    Broken arm    Insomnia     Past Surgical History:  Procedure Laterality Date   HERNIA REPAIR Bilateral 2023   VASECTOMY      Prior to Admission medications   Medication Sig Start Date End Date Taking? Authorizing Provider  methylphenidate (RITALIN) 20 MG tablet Take 1 tablet (20 mg total) by mouth 3 (three) times daily with meals. 04/10/22  Yes Danny Barrack, MD  zolpidem (AMBIEN) 10 MG tablet TK 1 T PO QHS PRN 10/25/20   Danny Barrack, MD    Current Outpatient Medications  Medication Sig Dispense Refill   methylphenidate (RITALIN) 20 MG tablet Take 1 tablet (20 mg total) by mouth 3 (three) times daily with meals. 90 tablet 0   zolpidem (AMBIEN) 10 MG tablet TK 1 T PO QHS PRN 30 tablet 5   Current Facility-Administered Medications  Medication Dose Route Frequency Provider Last Rate Last Admin   0.9 %  sodium chloride infusion  500 mL Intravenous Once Daryel November, MD        Allergies as of 04/10/2022   (No Known Allergies)    Family History  Problem Relation Age of Onset   Diabetes type I Daughter    Heart disease Neg Hx    Colon cancer Neg Hx    Prostate cancer Neg Hx     Social History   Socioeconomic History   Marital status: Married    Spouse name: Not on file   Number of children: Not on file   Years of education: Not on file   Highest education level: Not on file  Occupational History   Not on file  Tobacco Use   Smoking status: Former    Years: 28.00    Types: Cigarettes    Start date: 71    Quit date: 2013    Years since quitting: 10.9    Smokeless tobacco: Former  Substance and Sexual Activity   Alcohol use: Yes    Comment: occasional   Drug use: Never   Sexual activity: Yes  Other Topics Concern   Not on file  Social History Narrative   Not on file   Social Determinants of Health   Financial Resource Strain: Not on file  Food Insecurity: Not on file  Transportation Needs: Not on file  Physical Activity: Not on file  Stress: Not on file  Social Connections: Not on file  Intimate Partner Violence: Not on file    Review of Systems:  All other review of systems negative except as mentioned in the HPI.  Physical Exam: Vital signs BP 123/87   Pulse 60   Temp 97.8 F (36.6 C) (Temporal)   Resp (!) 24   Ht '6\' 4"'$  (1.93 m)   Wt 208 lb 9.6 oz (94.6 kg)   SpO2 97%   BMI 25.39 kg/m   General:   Alert,  Well-developed, well-nourished, pleasant and cooperative in NAD Airway:  Mallampati 1 Lungs:  Clear throughout to auscultation.   Heart:  Regular rate and  rhythm; no murmurs, clicks, rubs,  or gallops. Abdomen:  Soft, nontender and nondistended. Normal bowel sounds.   Neuro/Psych:  Normal mood and affect. A and O x 3   Savion Washam E. Candis Schatz, MD [facility]

## 2022-04-10 NOTE — Progress Notes (Unsigned)
Report to PACU, RN, vss, BBS= Clear.  

## 2022-04-13 ENCOUNTER — Telehealth: Payer: Self-pay

## 2022-04-13 NOTE — Telephone Encounter (Signed)
  Follow up Call-     04/10/2022    3:17 PM  Call back number  Post procedure Call Back phone  # [phone]  Permission to leave phone message Yes     Patient questions:  Do you have a fever, pain , or abdominal swelling? No. Pain Score  0 *  Have you tolerated food without any problems? Yes.    Have you been able to return to your normal activities? Yes.    Do you have any questions about your discharge instructions: Diet   No. Medications  No. Follow up visit  No.  Do you have questions or concerns about your Care? No.  Actions: * If pain score is 4 or above: No action needed, pain <4.

## 2022-04-15 NOTE — Progress Notes (Signed)
Danny Hurst, Danny Hurst news: the polyps that I removed during your recent examination were NOT precancerous.  You should continue to follow current colorectal cancer screening guidelines with a repeat colonoscopy in 10 years.    If you develop any new rectal bleeding, abdominal pain or significant bowel habit changes, please contact me before then.

## 2022-04-16 NOTE — Telephone Encounter (Signed)
LVM to return call  Medication was re faxed on 04/10/2022

## 2022-05-11 ENCOUNTER — Other Ambulatory Visit: Payer: Self-pay | Admitting: Family Medicine

## 2022-05-11 MED ORDER — METHYLPHENIDATE HCL 20 MG PO TABS: 20.0000 mg | ORAL_TABLET | Freq: Three times a day (TID) | ORAL | 0 refills | Status: DC

## 2022-05-11 NOTE — Telephone Encounter (Signed)
Last refill: 04/10/22 #90, 0 Last OV: 03/10/22 dx. ADHD

## 2022-05-12 ENCOUNTER — Other Ambulatory Visit: Payer: Self-pay | Admitting: Family Medicine

## 2022-05-12 NOTE — Telephone Encounter (Signed)
Patient states he has been out of methylphenidate (RITALIN) 20 MG tablet since 05/11/22 and is worried that [facility] located at [facility] will run out of the medication above due to shortage.  Requests RX for the above be sent asap to [facility] listed above.States Pharmacy told Patient the above medication is on back order at [facility] and they have limited supply.

## 2022-05-12 NOTE — Telephone Encounter (Signed)
Pt states the pharmacy he usually uses is out of  methylphenidate (RITALIN) 20 MG tablet  Can you please send RX to [facility], [address], phone number [phone].

## 2022-05-13 MED ORDER — METHYLPHENIDATE HCL 20 MG PO TABS: 20.0000 mg | ORAL_TABLET | Freq: Three times a day (TID) | ORAL | 0 refills | Status: DC

## 2022-06-12 ENCOUNTER — Other Ambulatory Visit: Payer: Self-pay | Admitting: Family Medicine

## 2022-06-12 ENCOUNTER — Other Ambulatory Visit (HOSPITAL_BASED_OUTPATIENT_CLINIC_OR_DEPARTMENT_OTHER): Payer: Self-pay

## 2022-06-12 MED ORDER — METHYLPHENIDATE HCL 20 MG PO TABS
20.0000 mg | ORAL_TABLET | Freq: Three times a day (TID) | ORAL | 0 refills | Status: DC
  Filled 2022-06-12: qty 90, 30d supply, fill #0

## 2022-06-12 NOTE — Telephone Encounter (Signed)
Patient has new Preferred Pharmacy-in system (listed below)   LAST APPOINTMENT DATE: 03/10/22  NEXT APPOINTMENT DATE:  MEDICATION:  methylphenidate (RITALIN) 20 MG tablet   Is the patient out of medication? Yes  PHARMACY: [facility] Phone: [phone]  Fax: [phone]      Let patient know to contact pharmacy at the end of the day to make sure medication is ready.  Please notify patient to allow 48-72 hours to process

## 2022-06-15 ENCOUNTER — Other Ambulatory Visit (HOSPITAL_BASED_OUTPATIENT_CLINIC_OR_DEPARTMENT_OTHER): Payer: Self-pay

## 2022-07-10 ENCOUNTER — Other Ambulatory Visit: Payer: Self-pay | Admitting: Family Medicine

## 2022-07-10 ENCOUNTER — Other Ambulatory Visit (HOSPITAL_BASED_OUTPATIENT_CLINIC_OR_DEPARTMENT_OTHER): Payer: Self-pay

## 2022-07-10 MED ORDER — METHYLPHENIDATE HCL 20 MG PO TABS
20.0000 mg | ORAL_TABLET | Freq: Three times a day (TID) | ORAL | 0 refills | Status: DC
  Filled 2022-07-10: qty 90, 30d supply, fill #0

## 2022-07-10 NOTE — Telephone Encounter (Signed)
Last refill: 06/12/22 #90,0 Last OV: 03/10/22 dx ADHD

## 2022-07-11 ENCOUNTER — Other Ambulatory Visit (HOSPITAL_BASED_OUTPATIENT_CLINIC_OR_DEPARTMENT_OTHER): Payer: Self-pay

## 2022-07-13 ENCOUNTER — Telehealth: Payer: Self-pay | Admitting: Family Medicine

## 2022-07-13 NOTE — Telephone Encounter (Signed)
LAST APPOINTMENT DATE: 03/10/2022   NEXT APPOINTMENT DATE: Visit date not found    LAST REFILL:10/25/2020  QTY: 56  Ref: 5

## 2022-07-13 NOTE — Telephone Encounter (Signed)
  Encourage patient to contact the pharmacy for refills or they can request refills through [facility]:  Please schedule appointment if longer than 1 year  NEXT APPOINTMENT DATE:  MEDICATION:  zolpidem (AMBIEN) 10 MG tablet   Is the patient out of medication? YES  PHARMACY:  [facility] Phone: [phone]  Fax: [phone]      Let patient know to contact pharmacy at the end of the day to make sure medication is ready.  Please notify patient to allow 48-72 hours to process

## 2022-07-14 ENCOUNTER — Other Ambulatory Visit (HOSPITAL_BASED_OUTPATIENT_CLINIC_OR_DEPARTMENT_OTHER): Payer: Self-pay

## 2022-07-14 MED ORDER — ZOLPIDEM TARTRATE 10 MG PO TABS
10.0000 mg | ORAL_TABLET | Freq: Every evening | ORAL | 5 refills | Status: DC | PRN
  Filled 2022-07-14: qty 30, 30d supply, fill #0
  Filled 2022-11-05: qty 30, 30d supply, fill #1

## 2022-08-10 ENCOUNTER — Other Ambulatory Visit (HOSPITAL_BASED_OUTPATIENT_CLINIC_OR_DEPARTMENT_OTHER): Payer: Self-pay

## 2022-08-10 ENCOUNTER — Other Ambulatory Visit: Payer: Self-pay | Admitting: Family Medicine

## 2022-08-10 MED ORDER — METHYLPHENIDATE HCL 20 MG PO TABS
20.0000 mg | ORAL_TABLET | Freq: Three times a day (TID) | ORAL | 0 refills | Status: DC
  Filled 2022-08-10: qty 90, 30d supply, fill #0

## 2022-08-10 NOTE — Telephone Encounter (Signed)
Last refill: 07/10/22 #90, 0 Last OV: 03/10/22 dx. ADHD

## 2022-09-08 ENCOUNTER — Other Ambulatory Visit: Payer: Self-pay | Admitting: Family Medicine

## 2022-09-08 ENCOUNTER — Other Ambulatory Visit (HOSPITAL_BASED_OUTPATIENT_CLINIC_OR_DEPARTMENT_OTHER): Payer: Self-pay

## 2022-09-08 MED ORDER — METHYLPHENIDATE HCL 20 MG PO TABS
20.0000 mg | ORAL_TABLET | Freq: Three times a day (TID) | ORAL | 0 refills | Status: DC
  Filled 2022-09-08: qty 90, 30d supply, fill #0

## 2022-09-08 NOTE — Telephone Encounter (Signed)
Needs office visit.

## 2022-10-05 IMAGING — US US ABDOMEN LIMITED
1 series · 14 of 25 positions shown · non-contrast
Comparison: None.

CLINICAL DATA: Right upper quadrant pain with nausea

EXAM:
ULTRASOUND ABDOMEN LIMITED RIGHT UPPER QUADRANT

[Series 1: us abdomen limited ruq (liver/gb) · 75 acquisitions, 14 frames shown]
[im 1/75]
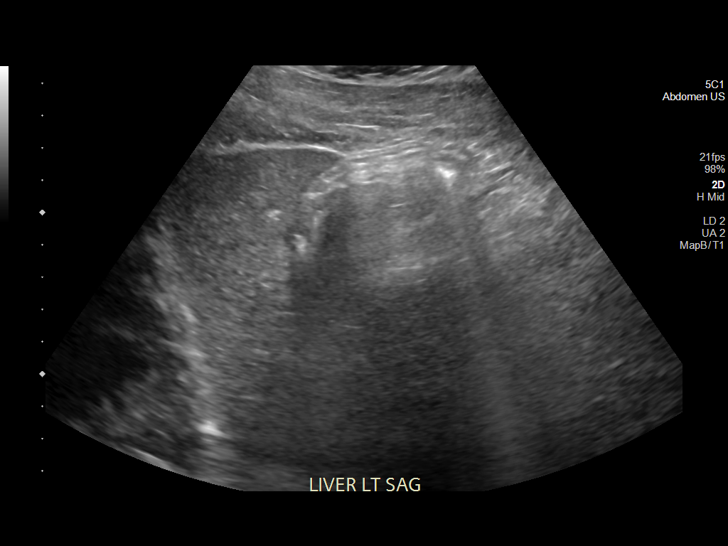
[im 7/75]
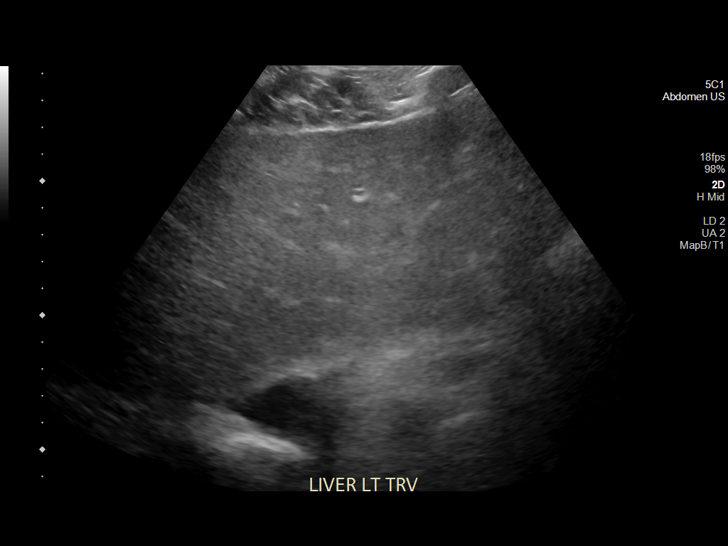
[im 13/75]
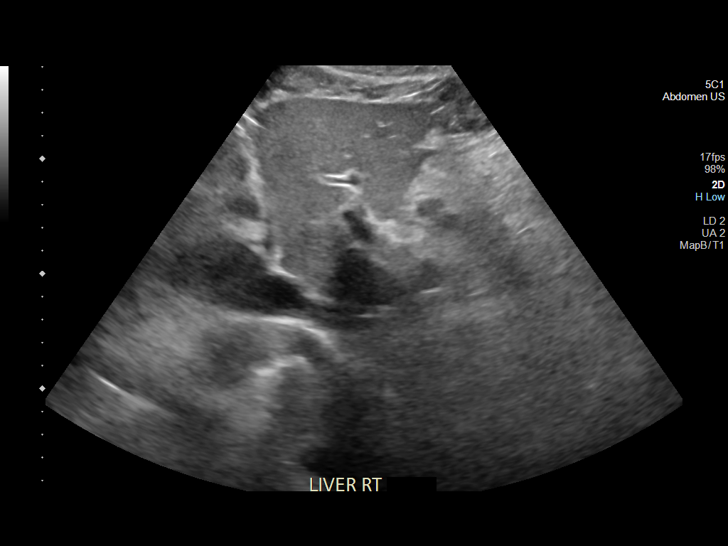
[im 19/75]
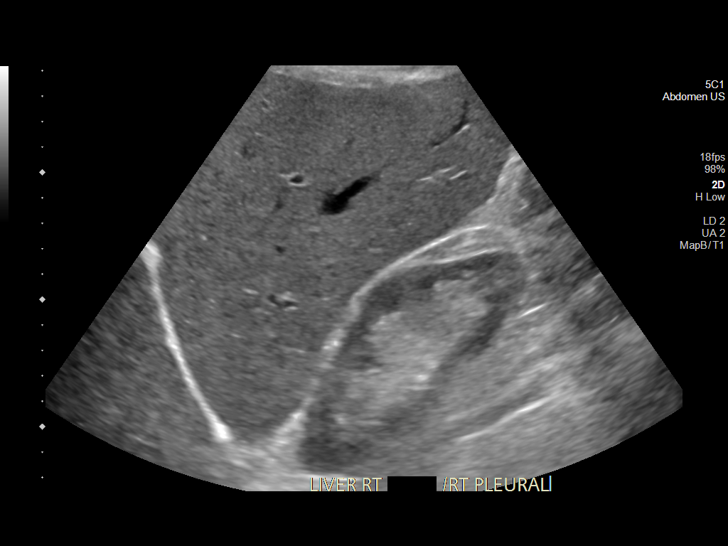
[im 25/75]
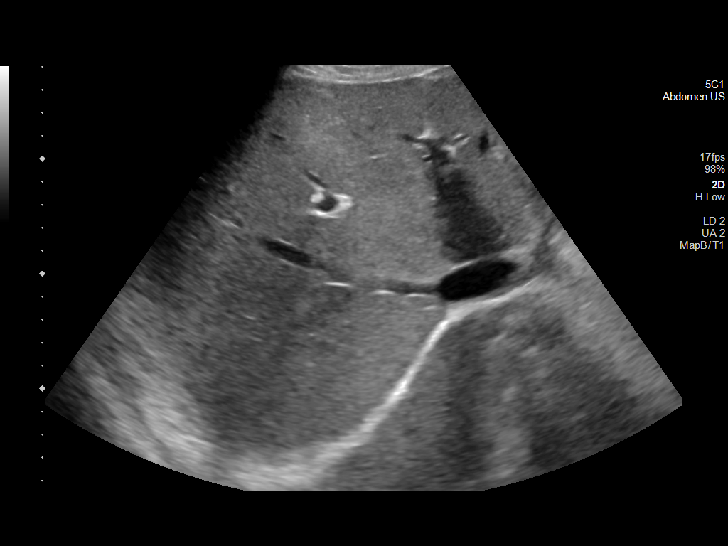
[im 28/75]
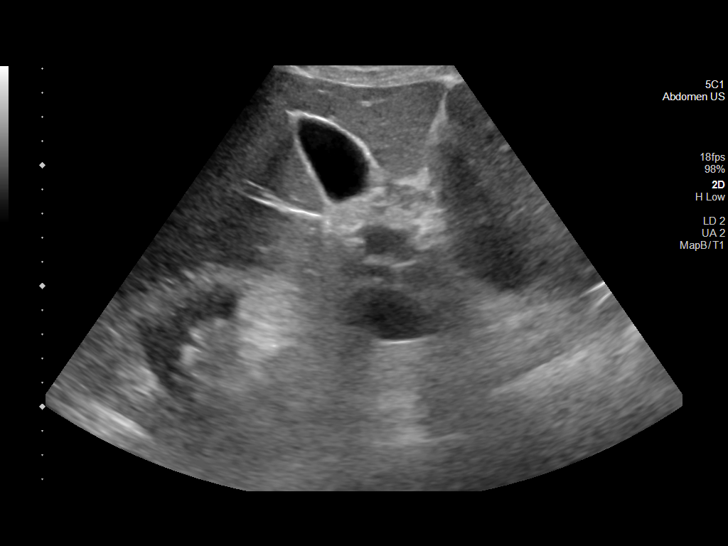
[im 34/75]
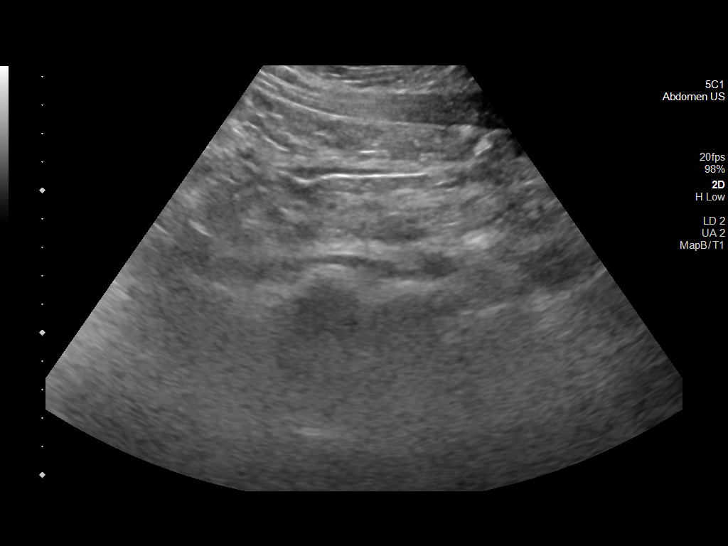
[im 41/75]
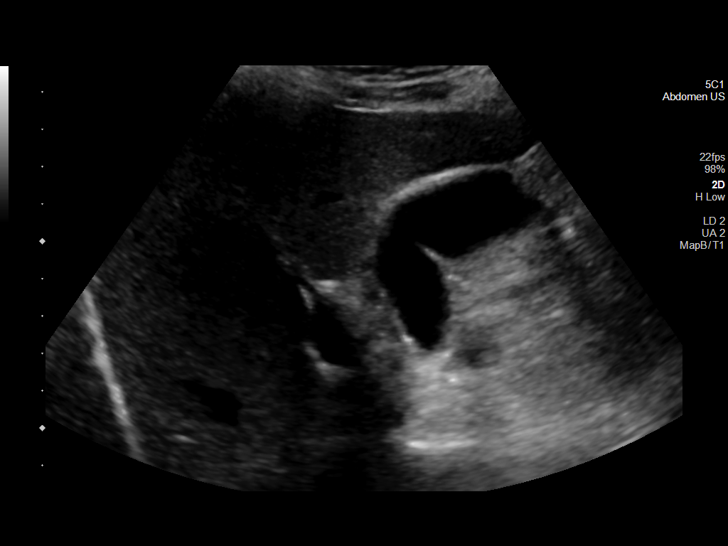
[im 47/75]
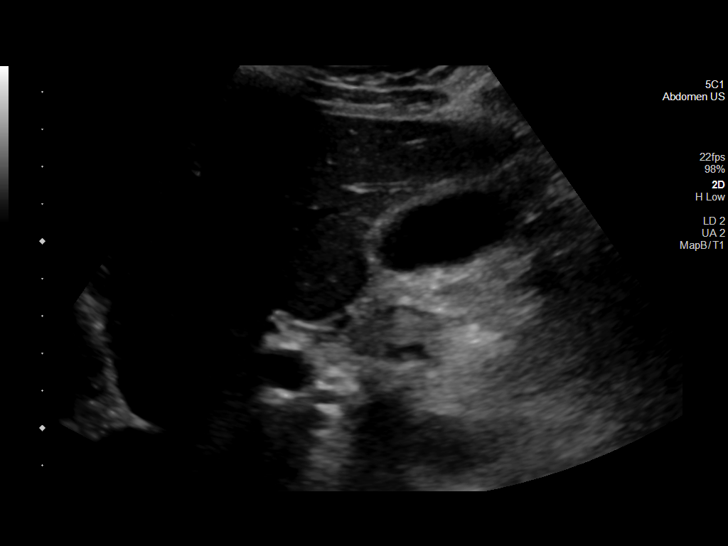
[im 50/75]
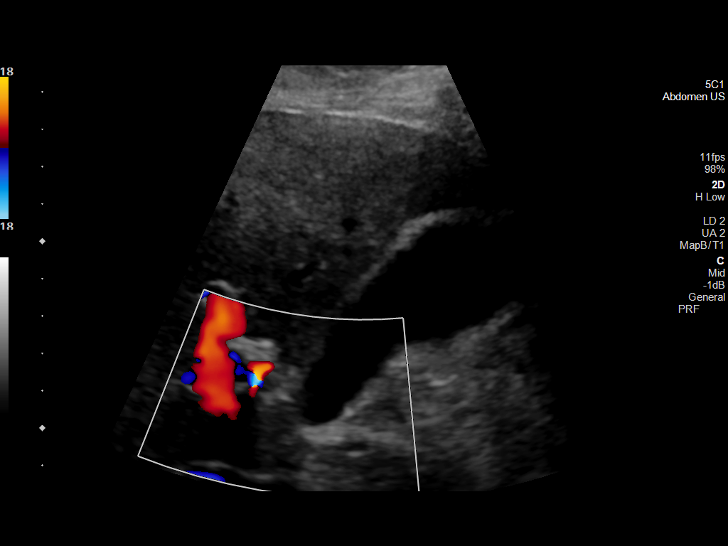
[im 56/75]
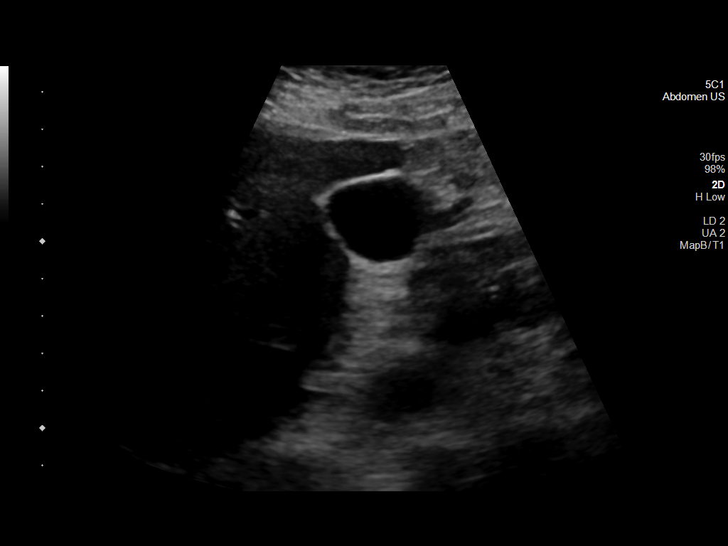
[im 62/75]
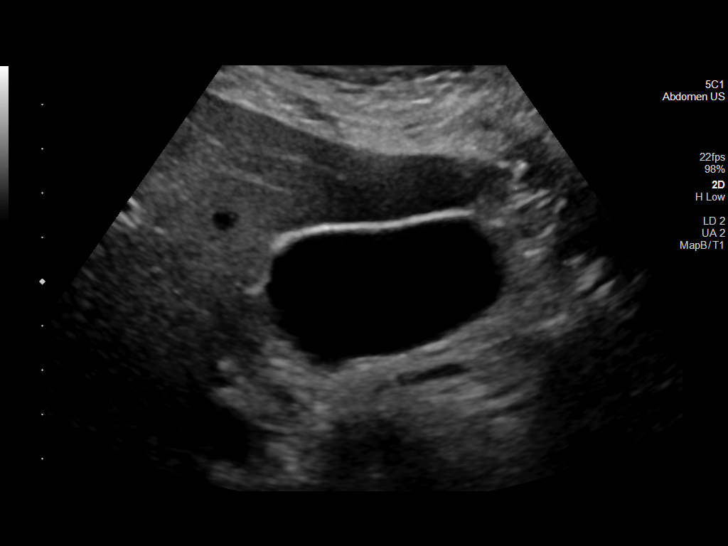
[im 68/75]
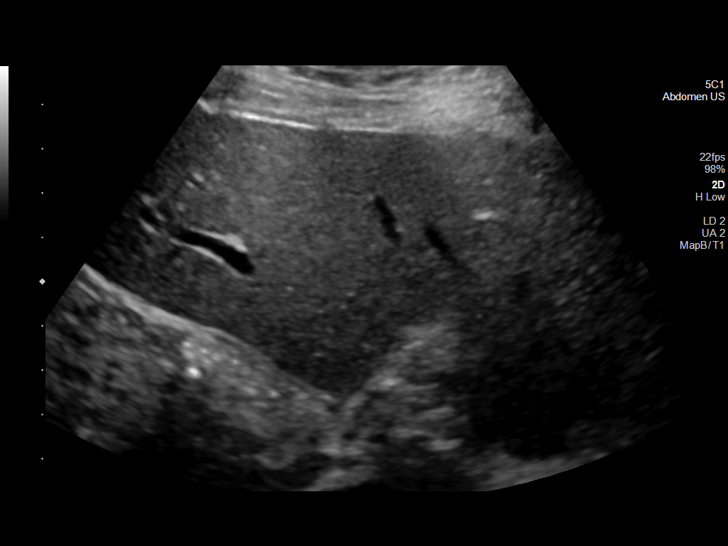
[im 75/75]
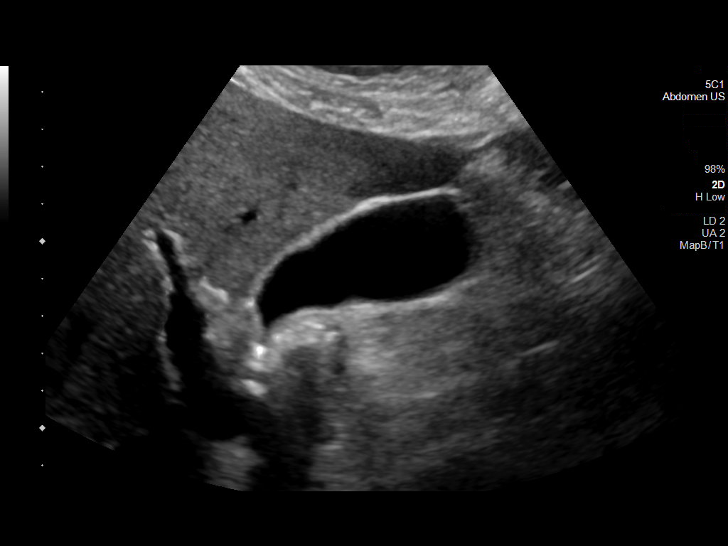

[14 of 25 positions shown; findings below may reference images not displayed]

FINDINGS: Gallbladder:

No gallstones or wall thickening visualized. No sonographic Murphy
sign noted by sonographer.

Common bile duct:

Diameter: 4 mm

Liver:

No focal lesion identified. Within normal limits in parenchymal
echogenicity. Portal vein is patent on color Doppler imaging with
normal direction of blood flow towards the liver.

Other: None.
IMPRESSION: Negative examination

## 2022-10-08 ENCOUNTER — Other Ambulatory Visit: Payer: Self-pay | Admitting: Family Medicine

## 2022-10-08 ENCOUNTER — Other Ambulatory Visit (HOSPITAL_BASED_OUTPATIENT_CLINIC_OR_DEPARTMENT_OTHER): Payer: Self-pay

## 2022-10-08 MED ORDER — METHYLPHENIDATE HCL 20 MG PO TABS
20.0000 mg | ORAL_TABLET | Freq: Three times a day (TID) | ORAL | 0 refills | Status: DC
  Filled 2022-10-08: qty 90, 30d supply, fill #0

## 2022-10-08 NOTE — Telephone Encounter (Signed)
Pt requesting refill for Ritalin 20 mg, Last OV 03/2022.

## 2022-10-30 ENCOUNTER — Other Ambulatory Visit (HOSPITAL_BASED_OUTPATIENT_CLINIC_OR_DEPARTMENT_OTHER): Payer: Self-pay

## 2022-10-30 MED ORDER — EMTRICITABINE-TENOFOVIR DF 200-300 MG PO TABS
1.0000 | ORAL_TABLET | Freq: Every day | ORAL | 0 refills | Status: DC
  Filled 2022-10-30: qty 28, 28d supply, fill #0

## 2022-10-30 MED ORDER — DOLUTEGRAVIR SODIUM 50 MG PO TABS
50.0000 mg | ORAL_TABLET | Freq: Every day | ORAL | 0 refills | Status: DC
  Filled 2022-10-30: qty 28, 28d supply, fill #0

## 2022-11-05 ENCOUNTER — Other Ambulatory Visit: Payer: Self-pay | Admitting: Family Medicine

## 2022-11-06 ENCOUNTER — Other Ambulatory Visit: Payer: Self-pay

## 2022-11-09 ENCOUNTER — Other Ambulatory Visit (HOSPITAL_BASED_OUTPATIENT_CLINIC_OR_DEPARTMENT_OTHER): Payer: Self-pay

## 2022-11-11 ENCOUNTER — Other Ambulatory Visit (HOSPITAL_BASED_OUTPATIENT_CLINIC_OR_DEPARTMENT_OTHER): Payer: Self-pay

## 2022-11-13 ENCOUNTER — Encounter: Payer: Self-pay | Admitting: Family Medicine

## 2022-11-13 ENCOUNTER — Other Ambulatory Visit (HOSPITAL_BASED_OUTPATIENT_CLINIC_OR_DEPARTMENT_OTHER): Payer: Self-pay

## 2022-11-13 ENCOUNTER — Telehealth: Payer: 59 | Admitting: Family Medicine

## 2022-11-13 VITALS — Ht 76.0 in | Wt 215.0 lb

## 2022-11-13 DIAGNOSIS — F909 Attention-deficit hyperactivity disorder, unspecified type: Secondary | ICD-10-CM

## 2022-11-13 DIAGNOSIS — G4700 Insomnia, unspecified: Secondary | ICD-10-CM | POA: Diagnosis not present

## 2022-11-13 MED ORDER — METHYLPHENIDATE HCL 20 MG PO TABS
20.0000 mg | ORAL_TABLET | Freq: Three times a day (TID) | ORAL | 0 refills | Status: DC
  Filled 2022-11-13: qty 90, 30d supply, fill #0

## 2022-11-13 NOTE — Assessment & Plan Note (Signed)
On Ritalin 20 mg 3 times daily.  Doing well with this.  Database without red flags.  Medications help with ability to stay focused and on task at work.  No significant side effects.  Will refill today.  Follow-up in 6 months for in person visit.

## 2022-11-13 NOTE — Progress Notes (Signed)
   Danny Hurst is a 47 y.o. male who presents today for a virtual office visit.  Assessment/Plan:  Chronic Problems Addressed Today: Attention deficit hyperactivity disorder (ADHD) On Ritalin 20 mg 3 times daily.  Doing well with this.  Database without red flags.  Medications help with ability to stay focused and on task at work.  No significant side effects.  Will refill today.  Follow-up in 6 months for in person visit.  Insomnia Stable on Ambien 10 mg nightly as needed.  Uses very sparingly.  Does not need refill today.     Subjective:  HPI:  See Assessment / plan for status of chronic conditions.  He is here today for medication medication follow-up.  He is currently on methylphenidate 20 mg 3 times daily for ADHD.  Doing well with this.  No significant side effects.  Medications help with his ability to stay focused and on task.  He also takes Ambien sparingly as needed.  Does not need refill on this.       Objective/Observations  Physical Exam: Gen: NAD, resting comfortably Pulm: Normal work of breathing Neuro: Grossly normal, moves all extremities Psych: Normal affect and thought content  Virtual Visit via Video   I connected with Danny Hurst on 11/13/22 at 11:20 AM EDT by a video enabled telemedicine application and verified that I am speaking with the correct person using two identifiers. The limitations of evaluation and management by telemedicine and the availability of in person appointments were discussed. The patient expressed understanding and agreed to proceed.   Patient location: Office at patient's work Provider location: Adult nurse [facility] [employer] Persons participating in the virtual visit: Myself and Patient     Katina Degree. Jimmey Ralph, MD 11/13/2022 11:53 AM

## 2022-11-13 NOTE — Assessment & Plan Note (Signed)
Stable on Ambien 10 mg nightly as needed.  Uses very sparingly.  Does not need refill today.

## 2022-12-14 ENCOUNTER — Other Ambulatory Visit: Payer: Self-pay | Admitting: Family Medicine

## 2022-12-15 ENCOUNTER — Other Ambulatory Visit (HOSPITAL_BASED_OUTPATIENT_CLINIC_OR_DEPARTMENT_OTHER): Payer: Self-pay

## 2022-12-15 MED ORDER — METHYLPHENIDATE HCL 20 MG PO TABS
20.0000 mg | ORAL_TABLET | Freq: Three times a day (TID) | ORAL | 0 refills | Status: DC
  Filled 2022-12-15: qty 90, 30d supply, fill #0

## 2022-12-17 ENCOUNTER — Other Ambulatory Visit (HOSPITAL_BASED_OUTPATIENT_CLINIC_OR_DEPARTMENT_OTHER): Payer: Self-pay

## 2022-12-17 ENCOUNTER — Telehealth: Payer: 59 | Admitting: Nurse Practitioner

## 2022-12-17 DIAGNOSIS — L237 Allergic contact dermatitis due to plants, except food: Secondary | ICD-10-CM | POA: Diagnosis not present

## 2022-12-17 MED ORDER — PREDNISONE 10 MG PO TABS
ORAL_TABLET | ORAL | 0 refills | Status: DC
Start: 2022-12-17 — End: 2023-05-03
  Filled 2022-12-17: qty 21, 6d supply, fill #0

## 2022-12-17 NOTE — Progress Notes (Signed)
Virtual Visit Consent   [name], you are scheduled for a virtual visit with a [facility] provider today. Just as with appointments in the office, your consent must be obtained to participate. Your consent will be active for this visit and any virtual visit you may have with one of our providers in the next 365 days. If you have a MyChart account, a copy of this consent can be sent to you electronically.  As this is a virtual visit, video technology does not allow for your provider to perform a traditional examination. This may limit your provider's ability to fully assess your condition. If your provider identifies any concerns that need to be evaluated in person or the need to arrange testing (such as labs, EKG, etc.), we will make arrangements to do so. Although advances in technology are sophisticated, we cannot ensure that it will always work on either your end or our end. If the connection with a video visit is poor, the visit may have to be switched to a telephone visit. With either a video or telephone visit, we are not always able to ensure that we have a secure connection.  By engaging in this virtual visit, you consent to the provision of healthcare and authorize for your insurance to be billed (if applicable) for the services provided during this visit. Depending on your insurance coverage, you may receive a charge related to this service.  I need to obtain your verbal consent now. Are you willing to proceed with your visit today? [name] has provided verbal consent on 12/17/2022 for a virtual visit (video or telephone). [name], FNP  Date: 12/17/2022 2:00 PM  Virtual Visit via Video Note   I, [name], connected with  Danny Hurst  ([id number], 1976/01/08) on 12/17/22 at  2:00 PM EDT by a video-enabled telemedicine application and verified that I am speaking with the correct person using two identifiers.  Location: Patient: Virtual Visit Location Patient:  Home Provider: Virtual Visit Location Provider: Home Office   I discussed the limitations of evaluation and management by telemedicine and the availability of in person appointments. The patient expressed understanding and agreed to proceed.    History of Present Illness: Danny Hurst is a 47 y.o. who identifies as a male who was assigned male at birth, and is being seen today for poison Ivy associated rash   He was exposed 5 days ago when he was in his backyard   He has tried 4 different over the counter creams and ointments without relief   He has needed prednisone in the past for poison Ivy relief   Rash on left foot inner ankle and has spread to his neck  He feels it on his arm as well Has an area in his groin as well    Most recent treatment was 2-3 years ago   Problems:  Patient Active Problem List   Diagnosis Date Noted   Attention deficit hyperactivity disorder (ADHD) 12/16/2018   Insomnia 12/16/2018    Allergies: No Known Allergies Medications:  Current Outpatient Medications:    methylphenidate (RITALIN) 20 MG tablet, Take 1 tablet (20 mg total) by mouth 3 (three) times daily with meals., Disp: 90 tablet, Rfl: 0   zolpidem (AMBIEN) 10 MG tablet, Take 1 tablet (10 mg total) by mouth at bedtime as needed., Disp: 30 tablet, Rfl: 5  Observations/Objective: Patient is well-developed, well-nourished in no acute distress.  Resting comfortably  at home.  Head is  normocephalic, atraumatic.  No labored breathing.  Speech is clear and coherent with logical content.  Patient is alert and oriented at baseline.  Blistering rash to foot and ankle observed   Assessment and Plan:  1. Poison ivy dermatitis  - predniSONE (STERAPRED UNI-PAK 21 TAB) 10 MG (21) TBPK tablet; Take 6 tablets on day one, 5 on day two, 4 on day three, 3 on day four, 2 on day five, and 1 on day six. Take with food.  Dispense: 21 tablet; Refill: 0    Wash with non scented soap only    Follow Up  Instructions: I discussed the assessment and treatment plan with the patient. The patient was provided an opportunity to ask questions and all were answered. The patient agreed with the plan and demonstrated an understanding of the instructions.  A copy of instructions were sent to the patient via MyChart unless otherwise noted below.    The patient was advised to call back or seek an in-person evaluation if the symptoms worsen or if the condition fails to improve as anticipated.  Time:  I spent 10 minutes with the patient via telehealth technology discussing the above problems/concerns.    [name], FNP

## 2023-01-14 ENCOUNTER — Other Ambulatory Visit: Payer: Self-pay | Admitting: Family Medicine

## 2023-01-14 ENCOUNTER — Other Ambulatory Visit: Payer: Self-pay

## 2023-01-14 ENCOUNTER — Other Ambulatory Visit (HOSPITAL_BASED_OUTPATIENT_CLINIC_OR_DEPARTMENT_OTHER): Payer: Self-pay

## 2023-01-14 MED ORDER — ZOLPIDEM TARTRATE 10 MG PO TABS
10.0000 mg | ORAL_TABLET | Freq: Every evening | ORAL | 5 refills | Status: DC | PRN
  Filled 2023-01-14: qty 30, 30d supply, fill #0
  Filled 2023-06-03: qty 30, 30d supply, fill #1

## 2023-01-14 MED ORDER — METHYLPHENIDATE HCL 20 MG PO TABS
20.0000 mg | ORAL_TABLET | Freq: Three times a day (TID) | ORAL | 0 refills | Status: DC
  Filled 2023-01-14: qty 30, 10d supply, fill #0
  Filled 2023-01-14: qty 60, 20d supply, fill #0

## 2023-01-15 ENCOUNTER — Other Ambulatory Visit (HOSPITAL_BASED_OUTPATIENT_CLINIC_OR_DEPARTMENT_OTHER): Payer: Self-pay

## 2023-01-15 ENCOUNTER — Other Ambulatory Visit: Payer: Self-pay

## 2023-01-18 ENCOUNTER — Other Ambulatory Visit (HOSPITAL_BASED_OUTPATIENT_CLINIC_OR_DEPARTMENT_OTHER): Payer: Self-pay

## 2023-02-12 ENCOUNTER — Other Ambulatory Visit: Payer: Self-pay

## 2023-02-12 ENCOUNTER — Emergency Department (HOSPITAL_BASED_OUTPATIENT_CLINIC_OR_DEPARTMENT_OTHER): Payer: Worker's Compensation

## 2023-02-12 ENCOUNTER — Emergency Department (HOSPITAL_BASED_OUTPATIENT_CLINIC_OR_DEPARTMENT_OTHER)
Admission: EM | Admit: 2023-02-12 | Discharge: 2023-02-12 | Disposition: A | Discharge status: Home / Self Care | Payer: Worker's Compensation | Attending: Emergency Medicine | Admitting: Emergency Medicine

## 2023-02-12 DIAGNOSIS — Y990 Civilian activity done for income or pay: Secondary | ICD-10-CM | POA: Insufficient documentation

## 2023-02-12 DIAGNOSIS — S199XXA Unspecified injury of neck, initial encounter: Secondary | ICD-10-CM | POA: Diagnosis present

## 2023-02-12 DIAGNOSIS — S161XXA Strain of muscle, fascia and tendon at neck level, initial encounter: Secondary | ICD-10-CM | POA: Diagnosis not present

## 2023-02-12 DIAGNOSIS — S0990XA Unspecified injury of head, initial encounter: Secondary | ICD-10-CM | POA: Diagnosis not present
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
## 2023-02-15 ENCOUNTER — Other Ambulatory Visit: Payer: Self-pay | Admitting: Family Medicine

## 2023-02-16 ENCOUNTER — Other Ambulatory Visit (HOSPITAL_BASED_OUTPATIENT_CLINIC_OR_DEPARTMENT_OTHER): Payer: Self-pay

## 2023-02-16 MED ORDER — METHYLPHENIDATE HCL 20 MG PO TABS
20.0000 mg | ORAL_TABLET | Freq: Three times a day (TID) | ORAL | 0 refills | Status: DC
  Filled 2023-02-16: qty 90, 30d supply, fill #0

## 2023-03-18 ENCOUNTER — Other Ambulatory Visit (HOSPITAL_BASED_OUTPATIENT_CLINIC_OR_DEPARTMENT_OTHER): Payer: Self-pay

## 2023-03-18 ENCOUNTER — Other Ambulatory Visit: Payer: Self-pay | Admitting: Family Medicine

## 2023-03-18 MED ORDER — METHYLPHENIDATE HCL 20 MG PO TABS
20.0000 mg | ORAL_TABLET | Freq: Three times a day (TID) | ORAL | 0 refills | Status: DC
Start: 2023-03-18 — End: 2023-04-16
  Filled 2023-03-18: qty 90, 30d supply, fill #0

## 2023-04-09 ENCOUNTER — Other Ambulatory Visit (HOSPITAL_BASED_OUTPATIENT_CLINIC_OR_DEPARTMENT_OTHER): Payer: Self-pay

## 2023-04-09 MED ORDER — CITALOPRAM HYDROBROMIDE 10 MG PO TABS
10.0000 mg | ORAL_TABLET | Freq: Every day | ORAL | 5 refills | Status: DC
  Filled 2023-04-09: qty 30, 30d supply, fill #0
  Filled 2023-05-04: qty 30, 30d supply, fill #1
  Filled 2023-06-03: qty 30, 30d supply, fill #2
  Filled 2023-07-02: qty 30, 30d supply, fill #3
  Filled 2023-08-02: qty 30, 30d supply, fill #4
  Filled 2023-08-30: qty 30, 30d supply, fill #5

## 2023-04-16 ENCOUNTER — Other Ambulatory Visit: Payer: Self-pay | Admitting: Family Medicine

## 2023-04-19 ENCOUNTER — Other Ambulatory Visit (HOSPITAL_BASED_OUTPATIENT_CLINIC_OR_DEPARTMENT_OTHER): Payer: Self-pay

## 2023-04-19 MED ORDER — METHYLPHENIDATE HCL 20 MG PO TABS
20.0000 mg | ORAL_TABLET | Freq: Three times a day (TID) | ORAL | 0 refills | Status: DC
  Filled 2023-04-19: qty 90, 30d supply, fill #0

## 2023-04-19 NOTE — Telephone Encounter (Signed)
Patient called to schedule f/u for med refill. Patient wants to know if this can be a VV due to his availability.

## 2023-04-22 ENCOUNTER — Other Ambulatory Visit (HOSPITAL_BASED_OUTPATIENT_CLINIC_OR_DEPARTMENT_OTHER): Payer: Self-pay

## 2023-04-23 ENCOUNTER — Ambulatory Visit: Payer: 59 | Admitting: Family Medicine

## 2023-05-03 ENCOUNTER — Encounter: Payer: Self-pay | Admitting: Family Medicine

## 2023-05-03 ENCOUNTER — Telehealth (INDEPENDENT_AMBULATORY_CARE_PROVIDER_SITE_OTHER): Payer: 59 | Admitting: Family Medicine

## 2023-05-03 VITALS — Ht 76.0 in | Wt 215.0 lb

## 2023-05-03 DIAGNOSIS — F909 Attention-deficit hyperactivity disorder, unspecified type: Secondary | ICD-10-CM | POA: Diagnosis not present

## 2023-05-03 DIAGNOSIS — G4700 Insomnia, unspecified: Secondary | ICD-10-CM | POA: Diagnosis not present

## 2023-05-03 NOTE — Progress Notes (Signed)
   Danny Hurst is a 47 y.o. male who presents today for a virtual office visit.  Assessment/Plan:  Chronic Problems Addressed Today: Attention deficit hyperactivity disorder (ADHD) Database no red flags.  On Ritalin 20 mg 3 times daily.  Does not need refill today.  Doing well with current regimen.  Medication helps with ability stay focused and on task at work.  No significant side effects.  He will come back in 6 months for in person office visit.  Insomnia Doing well on Ambien 10 mg nightly as needed.  Does not need refill today.  No significant side effects.  Preventative health care Advised him to come back in sooner for CPE.  He is getting labs checked annually through work he will send a message of most recent labs via MyChart when he gets a chance.    Subjective:  HPI:  See A/P for status of chronic conditions.  Patient is here today for follow-up for ADHD.  we last saw him 5 months ago.  At that time was doing well on Ritalin 20 mg 3 times daily.  Medications help with ability stay focused and on task.  No significant side effects.  Also uses Ambien sparingly as needed.  Does not need refill today.       Objective/Observations  Physical Exam: Gen: NAD, resting comfortably Pulm: Normal work of breathing Neuro: Grossly normal, moves all extremities Psych: Normal affect and thought content  Virtual Visit via Video   I connected with Adella Nissen on 05/03/23 at 11:40 AM EST by a video enabled telemedicine application and verified that I am speaking with the correct person using two identifiers. The limitations of evaluation and management by telemedicine and the availability of in person appointments were discussed. The patient expressed understanding and agreed to proceed.   Patient location: Home Provider location: [place] [facility] Persons participating in the virtual visit: Myself and Patient     Katina Degree. Jimmey Ralph, MD 05/03/2023 12:07 PM

## 2023-05-03 NOTE — Assessment & Plan Note (Signed)
Doing well on Ambien 10 mg nightly as needed.  Does not need refill today.  No significant side effects.

## 2023-05-03 NOTE — Assessment & Plan Note (Signed)
Database no red flags.  On Ritalin 20 mg 3 times daily.  Does not need refill today.  Doing well with current regimen.  Medication helps with ability stay focused and on task at work.  No significant side effects.  He will come back in 6 months for in person office visit.

## 2023-05-19 ENCOUNTER — Other Ambulatory Visit (HOSPITAL_BASED_OUTPATIENT_CLINIC_OR_DEPARTMENT_OTHER): Payer: Self-pay

## 2023-05-19 ENCOUNTER — Other Ambulatory Visit: Payer: Self-pay | Admitting: Family Medicine

## 2023-05-19 MED ORDER — METHYLPHENIDATE HCL 20 MG PO TABS
20.0000 mg | ORAL_TABLET | Freq: Three times a day (TID) | ORAL | 0 refills | Status: DC
  Filled 2023-05-19: qty 90, 30d supply, fill #0

## 2023-05-29 ENCOUNTER — Other Ambulatory Visit (HOSPITAL_BASED_OUTPATIENT_CLINIC_OR_DEPARTMENT_OTHER): Payer: Self-pay

## 2023-05-29 MED ORDER — FLUTICASONE PROPIONATE 50 MCG/ACT NA SUSP
NASAL | 0 refills | Status: DC
  Filled 2023-05-29: qty 16, 30d supply, fill #0

## 2023-05-29 MED ORDER — AMOXICILLIN-POT CLAVULANATE 875-125 MG PO TABS
ORAL_TABLET | ORAL | 0 refills | Status: DC
  Filled 2023-05-29: qty 10, 5d supply, fill #0

## 2023-05-29 MED ORDER — PREDNISONE 20 MG PO TABS
ORAL_TABLET | ORAL | 0 refills | Status: DC
  Filled 2023-05-29: qty 10, 5d supply, fill #0

## 2023-06-03 ENCOUNTER — Other Ambulatory Visit: Payer: Self-pay

## 2023-06-18 ENCOUNTER — Other Ambulatory Visit (HOSPITAL_BASED_OUTPATIENT_CLINIC_OR_DEPARTMENT_OTHER): Payer: Self-pay

## 2023-06-18 ENCOUNTER — Other Ambulatory Visit: Payer: Self-pay | Admitting: Family Medicine

## 2023-06-18 MED ORDER — METHYLPHENIDATE HCL 20 MG PO TABS
20.0000 mg | ORAL_TABLET | Freq: Three times a day (TID) | ORAL | 0 refills | Status: DC
  Filled 2023-06-18: qty 90, 30d supply, fill #0

## 2023-07-16 ENCOUNTER — Other Ambulatory Visit (HOSPITAL_BASED_OUTPATIENT_CLINIC_OR_DEPARTMENT_OTHER): Payer: Self-pay

## 2023-07-16 ENCOUNTER — Other Ambulatory Visit: Payer: Self-pay | Admitting: Family Medicine

## 2023-07-16 MED ORDER — METHYLPHENIDATE HCL 20 MG PO TABS
20.0000 mg | ORAL_TABLET | Freq: Three times a day (TID) | ORAL | 0 refills | Status: DC
  Filled 2023-07-16: qty 90, 30d supply, fill #0

## 2023-08-16 ENCOUNTER — Other Ambulatory Visit: Payer: Self-pay | Admitting: Family Medicine

## 2023-08-17 ENCOUNTER — Other Ambulatory Visit (HOSPITAL_BASED_OUTPATIENT_CLINIC_OR_DEPARTMENT_OTHER): Payer: Self-pay

## 2023-08-17 MED ORDER — METHYLPHENIDATE HCL 20 MG PO TABS
20.0000 mg | ORAL_TABLET | Freq: Three times a day (TID) | ORAL | 0 refills | Status: DC
  Filled 2023-08-17: qty 90, 30d supply, fill #0

## 2023-09-16 ENCOUNTER — Encounter: Payer: Self-pay | Admitting: Family Medicine

## 2023-09-16 ENCOUNTER — Ambulatory Visit: Admitting: Family Medicine

## 2023-09-16 ENCOUNTER — Other Ambulatory Visit (HOSPITAL_BASED_OUTPATIENT_CLINIC_OR_DEPARTMENT_OTHER): Payer: Self-pay

## 2023-09-16 VITALS — BP 132/80 | HR 67 | Temp 98.0°F | Ht 76.0 in | Wt 222.0 lb

## 2023-09-16 DIAGNOSIS — F4322 Adjustment disorder with anxiety: Secondary | ICD-10-CM | POA: Insufficient documentation

## 2023-09-16 DIAGNOSIS — Z13220 Encounter for screening for lipoid disorders: Secondary | ICD-10-CM | POA: Diagnosis not present

## 2023-09-16 DIAGNOSIS — Z131 Encounter for screening for diabetes mellitus: Secondary | ICD-10-CM | POA: Diagnosis not present

## 2023-09-16 DIAGNOSIS — L989 Disorder of the skin and subcutaneous tissue, unspecified: Secondary | ICD-10-CM | POA: Diagnosis not present

## 2023-09-16 DIAGNOSIS — G4700 Insomnia, unspecified: Secondary | ICD-10-CM

## 2023-09-16 DIAGNOSIS — F909 Attention-deficit hyperactivity disorder, unspecified type: Secondary | ICD-10-CM

## 2023-09-16 MED ORDER — METHYLPHENIDATE HCL 20 MG PO TABS
20.0000 mg | ORAL_TABLET | Freq: Three times a day (TID) | ORAL | 0 refills | Status: DC
  Filled 2023-09-16: qty 90, 30d supply, fill #0
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
## 2023-09-16 NOTE — Assessment & Plan Note (Signed)
 Database without red flags.  On methylphenidate  20 mg 3 times daily and tolerating well.  Will refill today.  Follow-up in 3 to 6 months.

## 2023-09-16 NOTE — Patient Instructions (Signed)
 It was very nice to see you today!  We will check blood work today.  I will refill your medications.  I will refer you to see the dermatologist.  Let me know in a few months if you would like to come off the Celexa .  Let us  know if you need any further assistance with this including referrals.  Return in about 1 year (around 09/15/2024) for Follow Up.   Take care, Dr Daneil Dunker  PLEASE NOTE:  If you had any lab tests, please let us  know if you have not heard back within a few days. You may see your results on mychart before we have a chance to review them but we will give you a call once they are reviewed by us .   If we ordered any referrals today, please let us  know if you have not heard from their office within the next week.   If you had any urgent prescriptions sent in today, please check with the pharmacy within an hour of our visit to make sure the prescription was transmitted appropriately.   Please try these tips to maintain a healthy lifestyle:  Eat at least 3 REAL meals and 1-2 snacks per day.  Aim for no more than 5 hours between eating.  If you eat breakfast, please do so within one hour of getting up.   Each meal should contain half fruits/vegetables, one quarter protein, and one quarter carbs (no bigger than a computer mouse)  Cut down on sweet beverages. This includes juice, soda, and sweet tea.   Drink at least 1 glass of water with each meal and aim for at least 8 glasses per day  Exercise at least 150 minutes every week.

## 2023-09-16 NOTE — Assessment & Plan Note (Signed)
 No red flags.  Likely sebaceous cyst versus hyperplasia.  Will place referral to dermatology for further evaluation and skin check.

## 2023-09-16 NOTE — Assessment & Plan Note (Signed)
 Stable on Ambien  10 mg nightly as needed.  Does not need refill today.  Tolerating well without side effects.

## 2023-09-17 LAB — COMPREHENSIVE METABOLIC PANEL WITH GFR
ALT: 20 U/L (ref 0–53)
AST: 19 U/L (ref 0–37)
Albumin: 4.5 g/dL (ref 3.5–5.2)
Alkaline Phosphatase: 54 U/L (ref 39–117)
BUN: 20 mg/dL (ref 6–23)
CO2: 27 meq/L (ref 19–32)
Calcium: 9.5 mg/dL (ref 8.4–10.5)
Chloride: 103 meq/L (ref 96–112)
Creatinine, Ser: 0.94 mg/dL (ref 0.40–1.50)
GFR: 96.27 mL/min (ref >60.00)
Glucose, Bld: 93 mg/dL (ref 70–99)
Potassium: 4.4 meq/L (ref 3.5–5.1)
Sodium: 137 meq/L (ref 135–145)
Total Bilirubin: 0.4 mg/dL (ref 0.2–1.2)
Total Protein: 7.6 g/dL (ref 6.0–8.3)

## 2023-09-17 LAB — LIPID PANEL
Cholesterol: 206 mg/dL — ABNORMAL HIGH (ref 0–200)
HDL: 74.1 mg/dL (ref >39.00)
LDL Cholesterol: 114 mg/dL — ABNORMAL HIGH (ref 0–99)
NonHDL: 132.05
Total CHOL/HDL Ratio: 3
Triglycerides: 89 mg/dL (ref 0.0–149.0)
VLDL: 17.8 mg/dL (ref 0.0–40.0)

## 2023-09-17 LAB — CBC
HCT: 43.1 % (ref 39.0–52.0)
Hemoglobin: 14.4 g/dL (ref 13.0–17.0)
MCHC: 33.4 g/dL (ref 30.0–36.0)
MCV: 89.8 fl (ref 78.0–100.0)
Platelets: 231 10*3/uL (ref 150.0–400.0)
RBC: 4.8 Mil/uL (ref 4.22–5.81)
RDW: 13.6 % (ref 11.5–15.5)
WBC: 6.9 10*3/uL (ref 4.0–10.5)

## 2023-09-17 LAB — HEMOGLOBIN A1C: Hgb A1c MFr Bld: 5.8 % (ref 4.6–6.5)

## 2023-09-21 ENCOUNTER — Ambulatory Visit: Payer: Self-pay | Admitting: Family Medicine

## 2023-09-21 LAB — TSH: TSH: 1.67 u[IU]/mL (ref 0.35–5.50)

## 2023-09-21 NOTE — Progress Notes (Signed)
 Cholesterol mildly elevated.  Do not need to start labs however he should continue to work on diet and exercise and we can recheck again in a year or so.  The rest of his labs are all at goal.

## 2023-09-28 ENCOUNTER — Other Ambulatory Visit (HOSPITAL_BASED_OUTPATIENT_CLINIC_OR_DEPARTMENT_OTHER): Payer: Self-pay

## 2023-10-01 ENCOUNTER — Encounter: Payer: Self-pay | Admitting: Family Medicine

## 2023-10-01 ENCOUNTER — Ambulatory Visit: Admitting: Family Medicine

## 2023-10-01 VITALS — BP 116/72 | HR 61 | Temp 97.7°F | Ht 76.0 in | Wt 225.0 lb

## 2023-10-01 DIAGNOSIS — E785 Hyperlipidemia, unspecified: Secondary | ICD-10-CM | POA: Diagnosis not present

## 2023-10-01 DIAGNOSIS — R229 Localized swelling, mass and lump, unspecified: Secondary | ICD-10-CM

## 2023-10-01 NOTE — Patient Instructions (Addendum)
 It was very nice to see you today!  You probably have benign lesions called lipomas.  We will check an ultrasound to confirm this.  Return if symptoms worsen or fail to improve.   Take care, Dr Daneil Dunker  PLEASE NOTE:  If you had any lab tests, please let us  know if you have not heard back within a few days. You may see your results on mychart before we have a chance to review them but we will give you a call once they are reviewed by us .   If we ordered any referrals today, please let us  know if you have not heard from their office within the next week.   If you had any urgent prescriptions sent in today, please check with the pharmacy within an hour of our visit to make sure the prescription was transmitted appropriately.   Please try these tips to maintain a healthy lifestyle:  Eat at least 3 REAL meals and 1-2 snacks per day.  Aim for no more than 5 hours between eating.  If you eat breakfast, please do so within one hour of getting up.   Each meal should contain half fruits/vegetables, one quarter protein, and one quarter carbs (no bigger than a computer mouse)  Cut down on sweet beverages. This includes juice, soda, and sweet tea.   Drink at least 1 glass of water with each meal and aim for at least 8 glasses per day  Exercise at least 150 minutes every week.

## 2023-10-01 NOTE — Progress Notes (Signed)
   Danny Hurst is a 48 y.o. male who presents today for an office visit.  Assessment/Plan:  New/Acute Problems: Skin lump Exam consistent with lipoma though we will check ultrasound to confirm this.  Reassured patient.  Chronic Problems Addressed Today: Dyslipidemia Slight elevation in LDL noted on recent labs.  He is working on lifestyle interventions.  Will recheck in 1 year.     Subjective:  HPI:  See assessment / plan for status of chronic conditions.  Patient is here today with mass on back and posterior left leg.  He is not sure how long they have been present but he has noticed them more recently.  His wife's friend's husband was recently diagnosed with sarcoma and he is concerned about this.  He does have some mild pain to the area when pressing.  No obvious triggering events.  No specific treatments tried.       Objective:  Physical Exam: BP 116/72   Pulse 61   Temp 97.7 F (36.5 C) (Temporal)   Ht 6\' 4"  (1.93 m)   Wt 225 lb (102.1 kg)   SpO2 97%   BMI 27.39 kg/m   Gen: No acute distress, resting comfortably Skin: Two approximately 1 cm discrete freely mobile firm lesions.  1 located on posterior right hip and another 1 on left thigh. Neuro: Grossly normal, moves all extremities Psych: Normal affect and thought content      Hareem Surowiec M. Daneil Dunker, MD 10/01/2023 2:35 PM

## 2023-10-01 NOTE — Assessment & Plan Note (Signed)
 Slight elevation in LDL noted on recent labs.  He is working on lifestyle interventions.  Will recheck in 1 year.

## 2023-10-06 ENCOUNTER — Other Ambulatory Visit: Payer: Self-pay | Admitting: Family Medicine

## 2023-10-07 ENCOUNTER — Other Ambulatory Visit (HOSPITAL_BASED_OUTPATIENT_CLINIC_OR_DEPARTMENT_OTHER): Payer: Self-pay

## 2023-10-07 MED ORDER — ZOLPIDEM TARTRATE 10 MG PO TABS
10.0000 mg | ORAL_TABLET | Freq: Every evening | ORAL | 5 refills | Status: DC | PRN
  Filled 2023-10-07: qty 30, 30d supply, fill #0
  Filled 2023-12-30: qty 30, 30d supply, fill #1
  Filled 2024-03-09: qty 30, 30d supply, fill #2

## 2023-10-11 ENCOUNTER — Encounter: Payer: Self-pay | Admitting: Family Medicine

## 2023-10-11 NOTE — Telephone Encounter (Signed)
 Last refill by unknown provider

## 2023-10-12 ENCOUNTER — Other Ambulatory Visit (HOSPITAL_BASED_OUTPATIENT_CLINIC_OR_DEPARTMENT_OTHER): Payer: Self-pay

## 2023-10-12 MED ORDER — CITALOPRAM HYDROBROMIDE 10 MG PO TABS
10.0000 mg | ORAL_TABLET | Freq: Every day | ORAL | 5 refills | Status: DC
  Filled 2023-10-12: qty 30, 30d supply, fill #0
  Filled 2023-11-11: qty 30, 30d supply, fill #1
  Filled 2023-12-08: qty 30, 30d supply, fill #2
  Filled 2024-01-07: qty 30, 30d supply, fill #3
  Filled 2024-02-07: qty 30, 30d supply, fill #4
  Filled 2024-03-16: qty 30, 30d supply, fill #5

## 2023-10-18 ENCOUNTER — Other Ambulatory Visit: Payer: Self-pay | Admitting: Family Medicine

## 2023-10-18 ENCOUNTER — Other Ambulatory Visit (HOSPITAL_BASED_OUTPATIENT_CLINIC_OR_DEPARTMENT_OTHER): Payer: Self-pay

## 2023-10-18 MED ORDER — METHYLPHENIDATE HCL 20 MG PO TABS
20.0000 mg | ORAL_TABLET | Freq: Three times a day (TID) | ORAL | 0 refills | Status: DC
  Filled 2023-10-18: qty 90, 30d supply, fill #0

## 2023-11-16 ENCOUNTER — Other Ambulatory Visit: Payer: Self-pay

## 2023-11-16 ENCOUNTER — Other Ambulatory Visit (HOSPITAL_BASED_OUTPATIENT_CLINIC_OR_DEPARTMENT_OTHER): Payer: Self-pay

## 2023-11-16 ENCOUNTER — Other Ambulatory Visit: Payer: Self-pay | Admitting: Family Medicine

## 2023-11-16 MED ORDER — METHYLPHENIDATE HCL 20 MG PO TABS
20.0000 mg | ORAL_TABLET | Freq: Three times a day (TID) | ORAL | 0 refills | Status: DC
  Filled 2023-11-16: qty 90, 30d supply, fill #0

## 2023-11-18 ENCOUNTER — Other Ambulatory Visit (HOSPITAL_BASED_OUTPATIENT_CLINIC_OR_DEPARTMENT_OTHER): Payer: Self-pay

## 2023-12-16 ENCOUNTER — Other Ambulatory Visit: Payer: Self-pay | Admitting: Family Medicine

## 2023-12-16 ENCOUNTER — Other Ambulatory Visit (HOSPITAL_BASED_OUTPATIENT_CLINIC_OR_DEPARTMENT_OTHER): Payer: Self-pay

## 2023-12-16 MED ORDER — METHYLPHENIDATE HCL 20 MG PO TABS
20.0000 mg | ORAL_TABLET | Freq: Three times a day (TID) | ORAL | 0 refills | Status: DC
  Filled 2023-12-16: qty 90, 30d supply, fill #0

## 2023-12-30 ENCOUNTER — Other Ambulatory Visit (HOSPITAL_BASED_OUTPATIENT_CLINIC_OR_DEPARTMENT_OTHER): Payer: Self-pay

## 2024-01-17 ENCOUNTER — Other Ambulatory Visit: Payer: Self-pay | Admitting: Family Medicine

## 2024-01-17 ENCOUNTER — Other Ambulatory Visit (HOSPITAL_BASED_OUTPATIENT_CLINIC_OR_DEPARTMENT_OTHER): Payer: Self-pay

## 2024-01-17 MED ORDER — METHYLPHENIDATE HCL 20 MG PO TABS
20.0000 mg | ORAL_TABLET | Freq: Three times a day (TID) | ORAL | 0 refills | Status: DC
  Filled 2024-01-17: qty 90, 30d supply, fill #0

## 2024-02-14 ENCOUNTER — Other Ambulatory Visit: Payer: Self-pay | Admitting: Family Medicine

## 2024-02-15 ENCOUNTER — Other Ambulatory Visit (HOSPITAL_BASED_OUTPATIENT_CLINIC_OR_DEPARTMENT_OTHER): Payer: Self-pay

## 2024-02-15 MED ORDER — METHYLPHENIDATE HCL 20 MG PO TABS
20.0000 mg | ORAL_TABLET | Freq: Three times a day (TID) | ORAL | 0 refills | Status: DC
  Filled 2024-02-15: qty 90, 30d supply, fill #0

## 2024-03-07 ENCOUNTER — Ambulatory Visit: Admitting: Physician Assistant

## 2024-03-09 ENCOUNTER — Other Ambulatory Visit (HOSPITAL_BASED_OUTPATIENT_CLINIC_OR_DEPARTMENT_OTHER): Payer: Self-pay

## 2024-03-16 ENCOUNTER — Other Ambulatory Visit: Payer: Self-pay

## 2024-03-16 ENCOUNTER — Other Ambulatory Visit: Payer: Self-pay | Admitting: Family Medicine

## 2024-03-16 ENCOUNTER — Other Ambulatory Visit (HOSPITAL_BASED_OUTPATIENT_CLINIC_OR_DEPARTMENT_OTHER): Payer: Self-pay

## 2024-03-16 MED ORDER — METHYLPHENIDATE HCL 20 MG PO TABS
20.0000 mg | ORAL_TABLET | Freq: Three times a day (TID) | ORAL | 0 refills | Status: DC
  Filled 2024-03-16: qty 90, 30d supply, fill #0

## 2024-04-14 ENCOUNTER — Other Ambulatory Visit (HOSPITAL_BASED_OUTPATIENT_CLINIC_OR_DEPARTMENT_OTHER): Payer: Self-pay

## 2024-04-14 ENCOUNTER — Encounter: Payer: Self-pay | Admitting: Family Medicine

## 2024-04-14 ENCOUNTER — Ambulatory Visit: Admitting: Family Medicine

## 2024-04-14 VITALS — BP 102/70 | HR 63 | Temp 97.5°F | Ht 76.0 in | Wt 224.2 lb

## 2024-04-14 DIAGNOSIS — F909 Attention-deficit hyperactivity disorder, unspecified type: Secondary | ICD-10-CM

## 2024-04-14 DIAGNOSIS — F411 Generalized anxiety disorder: Secondary | ICD-10-CM

## 2024-04-14 DIAGNOSIS — N529 Male erectile dysfunction, unspecified: Secondary | ICD-10-CM | POA: Insufficient documentation

## 2024-04-14 DIAGNOSIS — G4700 Insomnia, unspecified: Secondary | ICD-10-CM

## 2024-04-14 MED ORDER — METHYLPHENIDATE HCL 20 MG PO TABS
20.0000 mg | ORAL_TABLET | Freq: Three times a day (TID) | ORAL | 0 refills | Status: DC
  Filled 2024-04-14: qty 90, 30d supply, fill #0

## 2024-04-14 MED ORDER — TADALAFIL 10 MG PO TABS
5.0000 mg | ORAL_TABLET | ORAL | 1 refills | Status: AC | PRN
  Filled 2024-04-14: qty 30, 60d supply, fill #0

## 2024-04-14 MED ORDER — ZOLPIDEM TARTRATE 10 MG PO TABS
10.0000 mg | ORAL_TABLET | Freq: Every evening | ORAL | 5 refills | Status: AC | PRN
  Filled 2024-04-14: qty 30, 30d supply, fill #0

## 2024-04-14 MED ORDER — CITALOPRAM HYDROBROMIDE 10 MG PO TABS
10.0000 mg | ORAL_TABLET | Freq: Every day | ORAL | 3 refills | Status: AC
  Filled 2024-04-14: qty 90, 90d supply, fill #0
  Filled 2024-05-14: qty 90, 90d supply, fill #1

## 2024-04-14 NOTE — Patient Instructions (Signed)
 It was very nice to see you today!  You are probably having a side effect of the Celexa .  Please try the Cialis 5 to 10 mg daily as needed.  I will refill your other medications today.  Please send a message in a few weeks to let us  know how you are doing.  Return if symptoms worsen or fail to improve.   Take care, Dr Kennyth  PLEASE NOTE:  If you had any lab tests, please let us  know if you have not heard back within a few days. You may see your results on mychart before we have a chance to review them but we will give you a call once they are reviewed by us .   If we ordered any referrals today, please let us  know if you have not heard from their office within the next week.   If you had any urgent prescriptions sent in today, please check with the pharmacy within an hour of our visit to make sure the prescription was transmitted appropriately.   Please try these tips to maintain a healthy lifestyle:  Eat at least 3 REAL meals and 1-2 snacks per day.  Aim for no more than 5 hours between eating.  If you eat breakfast, please do so within one hour of getting up.   Each meal should contain half fruits/vegetables, one quarter protein, and one quarter carbs (no bigger than a computer mouse)  Cut down on sweet beverages. This includes juice, soda, and sweet tea.   Drink at least 1 glass of water with each meal and aim for at least 8 glasses per day  Exercise at least 150 minutes every week.

## 2024-04-14 NOTE — Progress Notes (Signed)
° °  Danny Hurst is a 48 y.o. male who presents today for an office visit.  Assessment/Plan:   Chronic Problems Addressed Today: Erectile dysfunction I had lengthy discussion with patient regarding his recent onset erectile dysfunction.  Discussed with patient this is likely due to side effect of the Celexa  however given that the Celexa  has given him significant benefit with managing his anxiety and mood he would like to continue with the Celexa  if possible.  We did discuss strategies to help mitigate potential side effects of the Celexa  including adjunct medications.  We also did discuss weaning off the Celexa .  We discussed trial of PDE inhibitor versus adding on Wellbutrin.  He would like to try a PDE inhibitor first.  We will start Cialis 5 to 10 mg daily as needed.  Discussed potential side effects.  He will follow-up with us  in a few weeks via MyChart and we can adjust as needed.  Generalized anxiety disorder Overall his anxiety symptoms have been well-controlled on Celexa  10 mg daily though there is some concern that is contributing to his ED symptoms.  He would like to continue on the Celexa  given the good benefit he has had in terms of his mental health thus far.  Will refill today at 10 mg daily and he will follow-up with us  in a few weeks via MyChart as above.  May consider weaning off this in the future if he has intolerable side effects.  Attention deficit hyperactivity disorder (ADHD) On Ritalin  20 mg 3 times daily.  Will refill today.  Medications help with ability to stay focused on task.  No significant side effects.  Recheck in 3 to 6 months.  Insomnia Stable on Ambien  10 mg nightly.  Tolerating well.  Will refill today.  No significant side effects.     Subjective:  HPI:  See assessment / plan for status of chronic conditions.  Patient is here today with primary concern of erectile dysfunction.  Symptoms have started within the last several months.  He has difficulty with  maintaining and sometimes achieving erection.  Today feels like his libido has been overall normal.  He has been on Celexa  for the last year or so for anxiety which does seem to be working well.  He notes that his wife would like for him to stay on Celexa  if possible.  He also does request refill of other medications today.  He is also on methylphenidate  and Ambien  for ADHD and insomnia.  Tolerating both of these well without significant side effects.         Objective:  Physical Exam: BP 102/70   Pulse 63   Temp (!) 97.5 F (36.4 C) (Temporal)   Ht 6' 4 (1.93 m)   Wt 224 lb 3.2 oz (101.7 kg)   SpO2 96%   BMI 27.29 kg/m   Gen: No acute distress, resting comfortably CV: Regular rate and rhythm with no murmurs appreciated Pulm: Normal work of breathing, clear to auscultation bilaterally with no crackles, wheezes, or rhonchi Neuro: Grossly normal, moves all extremities Psych: Normal affect and thought content      Danny Hurst M. Kennyth, MD 04/14/2024 2:23 PM

## 2024-04-14 NOTE — Assessment & Plan Note (Signed)
 On Ritalin  20 mg 3 times daily.  Will refill today.  Medications help with ability to stay focused on task.  No significant side effects.  Recheck in 3 to 6 months.

## 2024-04-14 NOTE — Assessment & Plan Note (Signed)
 Overall his anxiety symptoms have been well-controlled on Celexa  10 mg daily though there is some concern that is contributing to his ED symptoms.  He would like to continue on the Celexa  given the good benefit he has had in terms of his mental health thus far.  Will refill today at 10 mg daily and he will follow-up with us  in a few weeks via MyChart as above.  May consider weaning off this in the future if he has intolerable side effects.

## 2024-04-14 NOTE — Assessment & Plan Note (Signed)
 Stable on Ambien  10 mg nightly.  Tolerating well.  Will refill today.  No significant side effects.

## 2024-04-14 NOTE — Assessment & Plan Note (Signed)
 I had lengthy discussion with patient regarding his recent onset erectile dysfunction.  Discussed with patient this is likely due to side effect of the Celexa  however given that the Celexa  has given him significant benefit with managing his anxiety and mood he would like to continue with the Celexa  if possible.  We did discuss strategies to help mitigate potential side effects of the Celexa  including adjunct medications.  We also did discuss weaning off the Celexa .  We discussed trial of PDE inhibitor versus adding on Wellbutrin.  He would like to try a PDE inhibitor first.  We will start Cialis 5 to 10 mg daily as needed.  Discussed potential side effects.  He will follow-up with us  in a few weeks via MyChart and we can adjust as needed.

## 2024-05-08 ENCOUNTER — Encounter: Payer: Self-pay | Admitting: Family Medicine

## 2024-05-10 NOTE — Telephone Encounter (Signed)
 See note

## 2024-05-10 NOTE — Telephone Encounter (Signed)
 I appreciate the update.  I am glad he is doing well.  He should let us  know if he needs any further assistance.

## 2024-05-14 ENCOUNTER — Other Ambulatory Visit: Payer: Self-pay | Admitting: Family Medicine

## 2024-05-15 ENCOUNTER — Other Ambulatory Visit (HOSPITAL_BASED_OUTPATIENT_CLINIC_OR_DEPARTMENT_OTHER): Payer: Self-pay

## 2024-05-15 ENCOUNTER — Other Ambulatory Visit: Payer: Self-pay

## 2024-05-15 MED ORDER — METHYLPHENIDATE HCL 20 MG PO TABS
20.0000 mg | ORAL_TABLET | Freq: Three times a day (TID) | ORAL | 0 refills | Status: AC
  Filled 2024-05-15: qty 90, 30d supply, fill #0
